# Patient Record
Sex: Female | Born: 1970 | Race: White | Hispanic: No | Marital: Married | Smoking: Never smoker
Health system: Southern US, Community
[De-identification: ages and names within clinical notes are randomized; demographics above are authoritative.]

## PROBLEM LIST (undated history)

## (undated) DIAGNOSIS — J45909 Unspecified asthma, uncomplicated: Secondary | ICD-10-CM

## (undated) HISTORY — PX: ABDOMINAL HYSTERECTOMY: SHX81

## (undated) HISTORY — DX: Unspecified asthma, uncomplicated: J45.909

---

## 1997-05-18 HISTORY — PX: APPENDECTOMY: SHX54

## 1998-08-11 ENCOUNTER — Ambulatory Visit (HOSPITAL_COMMUNITY): Admission: RE | Admit: 1998-08-11 | Discharge: 1998-08-11 | Payer: Self-pay | Admitting: Gynecology

## 1998-08-11 ENCOUNTER — Encounter: Payer: Self-pay | Admitting: Gynecology

## 1998-08-19 ENCOUNTER — Ambulatory Visit: Admission: RE | Admit: 1998-08-19 | Discharge: 1998-08-19 | Payer: Self-pay | Admitting: Gynecology

## 1998-10-23 ENCOUNTER — Encounter: Payer: Self-pay | Admitting: Gynecology

## 1998-10-23 ENCOUNTER — Ambulatory Visit (HOSPITAL_COMMUNITY): Admission: RE | Admit: 1998-10-23 | Discharge: 1998-10-23 | Payer: Self-pay | Admitting: Gynecology

## 1998-11-19 ENCOUNTER — Ambulatory Visit: Admission: RE | Admit: 1998-11-19 | Discharge: 1998-11-19 | Payer: Self-pay | Admitting: Gynecology

## 1998-11-28 ENCOUNTER — Ambulatory Visit: Admission: RE | Admit: 1998-11-28 | Discharge: 1998-11-28 | Payer: Self-pay | Admitting: Obstetrics & Gynecology

## 1998-12-01 ENCOUNTER — Encounter: Payer: Self-pay | Admitting: Obstetrics & Gynecology

## 1998-12-01 ENCOUNTER — Ambulatory Visit (HOSPITAL_COMMUNITY): Admission: RE | Admit: 1998-12-01 | Discharge: 1998-12-01 | Payer: Self-pay | Admitting: Gynecology

## 1999-02-09 ENCOUNTER — Encounter: Payer: Self-pay | Admitting: Gynecology

## 1999-02-09 ENCOUNTER — Ambulatory Visit (HOSPITAL_COMMUNITY): Admission: RE | Admit: 1999-02-09 | Discharge: 1999-02-09 | Payer: Self-pay | Admitting: Gynecology

## 1999-02-11 ENCOUNTER — Ambulatory Visit: Admission: RE | Admit: 1999-02-11 | Discharge: 1999-02-11 | Payer: Self-pay | Admitting: Gynecology

## 1999-04-29 ENCOUNTER — Encounter: Payer: Self-pay | Admitting: Gynecology

## 1999-04-29 ENCOUNTER — Ambulatory Visit: Admission: RE | Admit: 1999-04-29 | Discharge: 1999-04-29 | Payer: Self-pay | Admitting: Gynecology

## 1999-07-15 ENCOUNTER — Other Ambulatory Visit: Admission: RE | Admit: 1999-07-15 | Discharge: 1999-07-15 | Payer: Self-pay | Admitting: Obstetrics & Gynecology

## 1999-08-05 ENCOUNTER — Encounter: Payer: Self-pay | Admitting: Gynecology

## 1999-08-05 ENCOUNTER — Ambulatory Visit (HOSPITAL_COMMUNITY): Admission: RE | Admit: 1999-08-05 | Discharge: 1999-08-05 | Payer: Self-pay | Admitting: Gynecology

## 1999-09-16 ENCOUNTER — Ambulatory Visit: Admission: RE | Admit: 1999-09-16 | Discharge: 1999-09-16 | Payer: Self-pay | Admitting: Gynecology

## 2000-01-19 ENCOUNTER — Encounter: Payer: Self-pay | Admitting: Gynecology

## 2000-01-19 ENCOUNTER — Ambulatory Visit: Admission: RE | Admit: 2000-01-19 | Discharge: 2000-01-19 | Payer: Self-pay | Admitting: Gynecology

## 2000-02-23 ENCOUNTER — Ambulatory Visit (HOSPITAL_COMMUNITY): Admission: RE | Admit: 2000-02-23 | Discharge: 2000-02-23 | Payer: Self-pay | Admitting: Gynecology

## 2000-02-23 ENCOUNTER — Encounter: Payer: Self-pay | Admitting: Gynecology

## 2000-04-21 ENCOUNTER — Encounter: Payer: Self-pay | Admitting: Gynecology

## 2000-04-21 ENCOUNTER — Ambulatory Visit (HOSPITAL_COMMUNITY): Admission: RE | Admit: 2000-04-21 | Discharge: 2000-04-21 | Payer: Self-pay | Admitting: Gynecology

## 2000-04-27 ENCOUNTER — Ambulatory Visit: Admission: RE | Admit: 2000-04-27 | Discharge: 2000-04-27 | Payer: Self-pay | Admitting: Gynecology

## 2000-12-15 ENCOUNTER — Ambulatory Visit (HOSPITAL_COMMUNITY): Admission: RE | Admit: 2000-12-15 | Discharge: 2000-12-15 | Payer: Self-pay | Admitting: Obstetrics and Gynecology

## 2000-12-15 ENCOUNTER — Encounter: Payer: Self-pay | Admitting: Obstetrics and Gynecology

## 2001-05-02 ENCOUNTER — Inpatient Hospital Stay (HOSPITAL_COMMUNITY): Admission: AD | Admit: 2001-05-02 | Discharge: 2001-05-04 | Payer: Self-pay | Admitting: Obstetrics & Gynecology

## 2001-11-30 ENCOUNTER — Encounter: Payer: Self-pay | Admitting: Obstetrics & Gynecology

## 2001-11-30 ENCOUNTER — Ambulatory Visit (HOSPITAL_COMMUNITY): Admission: RE | Admit: 2001-11-30 | Discharge: 2001-11-30 | Payer: Self-pay | Admitting: Obstetrics & Gynecology

## 2001-12-06 ENCOUNTER — Ambulatory Visit: Admission: RE | Admit: 2001-12-06 | Discharge: 2001-12-06 | Payer: Self-pay | Admitting: Gynecology

## 2002-01-05 ENCOUNTER — Ambulatory Visit (HOSPITAL_COMMUNITY): Admission: RE | Admit: 2002-01-05 | Discharge: 2002-01-05 | Payer: Self-pay | Admitting: Gynecology

## 2002-01-05 ENCOUNTER — Encounter: Payer: Self-pay | Admitting: Gynecology

## 2002-01-16 ENCOUNTER — Ambulatory Visit: Admission: RE | Admit: 2002-01-16 | Discharge: 2002-01-16 | Payer: Self-pay | Admitting: Gynecology

## 2002-02-01 ENCOUNTER — Encounter: Payer: Self-pay | Admitting: Gynecology

## 2002-02-06 ENCOUNTER — Inpatient Hospital Stay (HOSPITAL_COMMUNITY): Admission: RE | Admit: 2002-02-06 | Discharge: 2002-02-09 | Payer: Self-pay | Admitting: Obstetrics & Gynecology

## 2002-02-06 ENCOUNTER — Encounter (INDEPENDENT_AMBULATORY_CARE_PROVIDER_SITE_OTHER): Payer: Self-pay

## 2002-03-20 ENCOUNTER — Ambulatory Visit: Admission: RE | Admit: 2002-03-20 | Discharge: 2002-03-20 | Payer: Self-pay | Admitting: Gynecology

## 2003-05-23 ENCOUNTER — Ambulatory Visit (HOSPITAL_COMMUNITY): Admission: RE | Admit: 2003-05-23 | Discharge: 2003-05-23 | Payer: Self-pay | Admitting: Gastroenterology

## 2004-02-16 HISTORY — PX: LAPAROSCOPIC CHOLECYSTECTOMY: SUR755

## 2004-02-17 ENCOUNTER — Encounter: Admission: RE | Admit: 2004-02-17 | Discharge: 2004-02-17 | Payer: Self-pay | Admitting: Obstetrics & Gynecology

## 2004-03-12 ENCOUNTER — Emergency Department (HOSPITAL_COMMUNITY): Admission: EM | Admit: 2004-03-12 | Discharge: 2004-03-13 | Payer: Self-pay | Admitting: Emergency Medicine

## 2004-03-13 ENCOUNTER — Ambulatory Visit (HOSPITAL_COMMUNITY): Admission: RE | Admit: 2004-03-13 | Discharge: 2004-03-13 | Payer: Self-pay | Admitting: Emergency Medicine

## 2004-03-13 ENCOUNTER — Encounter (INDEPENDENT_AMBULATORY_CARE_PROVIDER_SITE_OTHER): Payer: Self-pay | Admitting: Specialist

## 2004-03-13 ENCOUNTER — Inpatient Hospital Stay (HOSPITAL_COMMUNITY): Admission: EM | Admit: 2004-03-13 | Discharge: 2004-03-14 | Payer: Self-pay | Admitting: General Surgery

## 2004-06-05 ENCOUNTER — Emergency Department (HOSPITAL_COMMUNITY): Admission: EM | Admit: 2004-06-05 | Discharge: 2004-06-05 | Payer: Self-pay

## 2007-03-02 ENCOUNTER — Encounter: Admission: RE | Admit: 2007-03-02 | Discharge: 2007-03-02 | Payer: Self-pay | Admitting: Obstetrics & Gynecology

## 2007-05-11 ENCOUNTER — Ambulatory Visit: Payer: Self-pay | Admitting: Family Medicine

## 2007-05-11 DIAGNOSIS — L219 Seborrheic dermatitis, unspecified: Secondary | ICD-10-CM | POA: Insufficient documentation

## 2007-05-11 DIAGNOSIS — I10 Essential (primary) hypertension: Secondary | ICD-10-CM | POA: Insufficient documentation

## 2007-05-11 LAB — CONVERTED CEMR LAB
BUN: 12 mg/dL (ref 6–23)
Basophils Absolute: 0 10*3/uL (ref 0.0–0.1)
Basophils Relative: 0.2 % (ref 0.0–1.0)
Calcium: 9.5 mg/dL (ref 8.4–10.5)
Chloride: 101 meq/L (ref 96–112)
Eosinophils Absolute: 0.2 10*3/uL (ref 0.0–0.6)
GFR calc Af Amer: 145 mL/min
HCT: 41.9 % (ref 36.0–46.0)
Hemoglobin: 14.5 g/dL (ref 12.0–15.0)
Lymphocytes Relative: 33 % (ref 12.0–46.0)
MCHC: 34.5 g/dL (ref 30.0–36.0)
MCV: 80 fL (ref 78.0–100.0)
Monocytes Absolute: 0.5 10*3/uL (ref 0.2–0.7)
RBC: 5.23 M/uL — ABNORMAL HIGH (ref 3.87–5.11)
Sodium: 142 meq/L (ref 135–145)
VLDL: 17 mg/dL (ref 0–40)
WBC: 6 10*3/uL (ref 4.5–10.5)

## 2007-05-12 ENCOUNTER — Telehealth (INDEPENDENT_AMBULATORY_CARE_PROVIDER_SITE_OTHER): Payer: Self-pay | Admitting: *Deleted

## 2007-07-31 ENCOUNTER — Ambulatory Visit: Payer: Self-pay | Admitting: Family Medicine

## 2007-07-31 DIAGNOSIS — R0989 Other specified symptoms and signs involving the circulatory and respiratory systems: Secondary | ICD-10-CM | POA: Insufficient documentation

## 2007-07-31 DIAGNOSIS — R0609 Other forms of dyspnea: Secondary | ICD-10-CM | POA: Insufficient documentation

## 2007-08-02 ENCOUNTER — Telehealth (INDEPENDENT_AMBULATORY_CARE_PROVIDER_SITE_OTHER): Payer: Self-pay | Admitting: *Deleted

## 2007-08-11 ENCOUNTER — Ambulatory Visit: Payer: Self-pay | Admitting: Family Medicine

## 2007-08-11 ENCOUNTER — Telehealth (INDEPENDENT_AMBULATORY_CARE_PROVIDER_SITE_OTHER): Payer: Self-pay | Admitting: *Deleted

## 2007-08-16 ENCOUNTER — Ambulatory Visit: Payer: Self-pay | Admitting: Internal Medicine

## 2007-09-01 ENCOUNTER — Encounter: Payer: Self-pay | Admitting: Internal Medicine

## 2007-09-27 ENCOUNTER — Ambulatory Visit: Payer: Self-pay | Admitting: Internal Medicine

## 2007-12-06 ENCOUNTER — Telehealth (INDEPENDENT_AMBULATORY_CARE_PROVIDER_SITE_OTHER): Payer: Self-pay | Admitting: *Deleted

## 2008-08-30 ENCOUNTER — Ambulatory Visit: Payer: Self-pay | Admitting: Internal Medicine

## 2008-08-30 DIAGNOSIS — J189 Pneumonia, unspecified organism: Secondary | ICD-10-CM | POA: Insufficient documentation

## 2008-10-23 ENCOUNTER — Ambulatory Visit: Payer: Self-pay | Admitting: Internal Medicine

## 2008-12-31 ENCOUNTER — Telehealth (INDEPENDENT_AMBULATORY_CARE_PROVIDER_SITE_OTHER): Payer: Self-pay | Admitting: *Deleted

## 2009-01-01 ENCOUNTER — Ambulatory Visit: Payer: Self-pay | Admitting: Family Medicine

## 2009-01-02 ENCOUNTER — Telehealth: Payer: Self-pay | Admitting: Family Medicine

## 2009-01-02 ENCOUNTER — Telehealth (INDEPENDENT_AMBULATORY_CARE_PROVIDER_SITE_OTHER): Payer: Self-pay | Admitting: *Deleted

## 2009-01-10 ENCOUNTER — Ambulatory Visit: Payer: Self-pay | Admitting: Internal Medicine

## 2009-01-10 ENCOUNTER — Telehealth: Payer: Self-pay | Admitting: Internal Medicine

## 2009-01-10 DIAGNOSIS — R091 Pleurisy: Secondary | ICD-10-CM | POA: Insufficient documentation

## 2009-01-10 DIAGNOSIS — J45909 Unspecified asthma, uncomplicated: Secondary | ICD-10-CM | POA: Insufficient documentation

## 2009-01-10 DIAGNOSIS — R071 Chest pain on breathing: Secondary | ICD-10-CM | POA: Insufficient documentation

## 2009-01-16 ENCOUNTER — Encounter: Payer: Self-pay | Admitting: Internal Medicine

## 2009-01-20 ENCOUNTER — Ambulatory Visit: Payer: Self-pay | Admitting: Internal Medicine

## 2009-01-21 ENCOUNTER — Encounter (INDEPENDENT_AMBULATORY_CARE_PROVIDER_SITE_OTHER): Payer: Self-pay | Admitting: *Deleted

## 2009-02-11 ENCOUNTER — Ambulatory Visit: Payer: Self-pay | Admitting: Family Medicine

## 2009-02-11 DIAGNOSIS — J309 Allergic rhinitis, unspecified: Secondary | ICD-10-CM | POA: Insufficient documentation

## 2009-02-11 DIAGNOSIS — J029 Acute pharyngitis, unspecified: Secondary | ICD-10-CM | POA: Insufficient documentation

## 2009-03-06 ENCOUNTER — Ambulatory Visit: Payer: Self-pay | Admitting: Internal Medicine

## 2009-04-18 ENCOUNTER — Ambulatory Visit: Payer: Self-pay | Admitting: Internal Medicine

## 2009-04-18 DIAGNOSIS — R635 Abnormal weight gain: Secondary | ICD-10-CM | POA: Insufficient documentation

## 2009-07-22 ENCOUNTER — Telehealth (INDEPENDENT_AMBULATORY_CARE_PROVIDER_SITE_OTHER): Payer: Self-pay | Admitting: *Deleted

## 2010-11-06 ENCOUNTER — Ambulatory Visit
Admission: RE | Admit: 2010-11-06 | Discharge: 2010-11-06 | Payer: Self-pay | Source: Home / Self Care | Attending: Family Medicine | Admitting: Family Medicine

## 2010-11-06 DIAGNOSIS — J019 Acute sinusitis, unspecified: Secondary | ICD-10-CM | POA: Insufficient documentation

## 2010-11-19 NOTE — Assessment & Plan Note (Signed)
Summary: [name]   Vital Signs:  Patient profile:   40 year old female Weight:      261 pounds BMI:     42.93 Temp:     98.1 degrees F oral BP sitting:   120 / 68  (left arm)  Vitals Entered By: Doristine Devoid CMA (November 06, 2010 11:23 AM) CC: sinus congestion and HA   History of Present Illness: Frances [name] here today for ? sinus infxn.  sxs started 3 weeks ago w/ sinus headaches, 'the stabbing pain behind the eye headache'.  + fatigue, body aches.  increased SOB.  has had infrequent cough productive of green sputum.  using Qvar and albuterol as needed.  had some associated nausea w/ 1 episode of vomiting.  increased GERD- not currently on meds.  having facial pain/pressure.  no ear pain.  some nasal congestion.  Current Medications (verified): 1)  Estradiol 0.5 Mg Tabs (Estradiol) .Marland Kitchen.. 1 Once Daily 2)  Proair Hfa 108 (90 Base) Mcg/act  Aers (Albuterol Sulfate) .... Up 2 Puffs Every 4 Hours If Needed For Wheezing or Breathlessness 3)  Anti-Oxidant  Tabs (Multiple Vitamin) .... Once Daily 4)  Diurex .... Otc As Needed 5)  Qvar 40 Mcg/act  Aers (Beclomethasone Dipropionate) .... 2 Puffs First Thing  in Am and 2 Puffs Again in Pm About 12 Hours Later 6)  Mucinex 600 Mg Xr12h-Tab (Guaifenesin) .... 2 Every 12 Hours For Cough or Congestion  Allergies (verified): No Known Drug Allergies  Review of Systems      See HPI  Physical Exam  General:  Well-developed,well-nourished,in no acute distress; alert,appropriate and cooperative throughout examination.  Overwt Head:  Normocephalic and atraumatic without obvious abnormalities. No apparent alopecia or balding.  + TTP over sinuses Eyes:  no injxn or inflammation Ears:  External ear exam shows no significant lesions or deformities.  Otoscopic examination reveals clear canals, tympanic membranes are intact bilaterally without bulging, retraction, inflammation or discharge. Hearing is grossly normal bilaterally. Nose:   edematous turbinates, mucosal pallor, clear rhinorrhea Mouth:  + post nasal drip, + tonsilar enlargement w/out exudates. Neck:  No deformities, masses, or tenderness noted. Lungs:  Normal respiratory effort, chest expands symmetrically. Lungs are clear to auscultation, no crackles or wheezes. Heart:  Normal rate and regular rhythm. S1 and S2 normal without gallop, murmur, click, rub or other extra sounds.   Impression & Recommendations:  Problem # 1:  SINUSITIS - ACUTE-NOS (ICD-461.9) Assessment New sxs and PE consistent w/ sinus infxn.  start abx.  reviewed supportive care and red flags that should prompt return.  Pt expresses understanding and is in agreement w/ this plan. Her updated medication list for this problem includes:    Mucinex 600 Mg Xr12h-tab (Guaifenesin) .Marland Kitchen... 2 every 12 hours for cough or congestion    Amoxicillin 500 Mg Tabs (Amoxicillin) .Marland Kitchen... 2 tabs by mouth two times a day x10 days.  take w/ food.  Complete Medication List: 1)  Estradiol 0.5 Mg Tabs (Estradiol) .Marland Kitchen.. 1 once daily 2)  Proair Hfa 108 (90 Base) Mcg/act Aers (Albuterol sulfate) .... Up 2 puffs every 4 hours if needed for wheezing or breathlessness 3)  Anti-oxidant Tabs (Multiple vitamin) .... Once daily 4)  Diurex  .... Otc as needed 5)  Qvar 40 Mcg/act Aers (Beclomethasone dipropionate) .... 2 puffs first thing  in am and 2 puffs again in pm about 12 hours later 6)  Mucinex 600 Mg Xr12h-tab (Guaifenesin) .... 2 every 12 hours for cough or congestion  7)  Amoxicillin 500 Mg Tabs (Amoxicillin) .... 2 tabs by mouth two times a day x10 days.  take w/ food. 8)  Prilosec 40 Mg Cpdr (Omeprazole) .Marland Kitchen.. 1 tab daily  Patient Instructions: 1)  Start the Amoxicillin as directed for the sinus infection- take w/ food 2)  Drink plenty of fluids 3)  Add Mucinex to thin your congestion 4)  Start the Prilosec for the acid reflux 5)  Ibuprofen as needed for pain or fever 6)  Use the Qvar regularly while not feeling  well 7)  Albuterol as needed 8)  Call with any questions or concerns 9)  Hang in there!!! Prescriptions: QVAR 40 MCG/ACT  AERS (BECLOMETHASONE DIPROPIONATE) 2 puffs first thing  in am and 2 puffs again in pm about 12 hours later  #1 x 3   Entered and Authorized by:   Neena Rhymes MD   Signed by:   Neena Rhymes MD on 11/06/2010   Method used:   Electronically to        [facility] [address] [phone]* (retail)       [address]       [place], [place]  [address]       Ph: [phone] or [phone]       Fax: [phone]   RxID:   [id number] PRILOSEC 40 MG CPDR (OMEPRAZOLE) 1 tab daily  #30 x 1   Entered and Authorized by:   Neena Rhymes MD   Signed by:   Neena Rhymes MD on 11/06/2010   Method used:   [facility]ically to        [facility] [address] [phone]* (retail)       [address]       [place], [place]  [address]       Ph: [phone] or [phone]       Fax: [phone]   RxID:   [id number] AMOXICILLIN 500 MG TABS (AMOXICILLIN) 2 tabs by mouth two times a day x10 days.  take w/ food.  #40 x 0   Entered and Authorized by:   Neena Rhymes MD   Signed by:   Neena Rhymes MD on 11/06/2010   Method used:   [facility]ronically to        [facility] [address] [phone]* (retail)       [address]       [place], [place]  [address]       Ph: [phone] or [phone]       Fax: [phone]   RxID:   [id number]    Orders Added: 1)  Est. Patient Level III [78938]

## 2011-03-02 NOTE — Assessment & Plan Note (Signed)
[facility]                             PULMONARY OFFICE NOTE   NAME:[name], Frances [name] PHILIPPS                  MRN:          [id number]  DATE:08/16/2007                            DOB:          06/25/1971    CHIEF COMPLAINT:  Dyspnea.   HISTORY:  A 40 year old white female, never smoker, presenting with new  onset dyspnea that occurs in spells several times a year and lasting  several weeks.  Typically made somewhat better from prednisone and  albuterol.  She returns today having recently received another course  of prednisone and albuterol  stating she is almost back to normal.  However, she is still short of breath walking any significant distance  (for instance across the parking lot) and had a constant sensation of  the urge to clear her throat while she was in my office.  Otherwise, she  feels normal now.   The patient has no history of any childhood or adult asthma and had no  trouble with pregnancy in terms of any respiratory complaints with  children now 82 and 83 years old.  She does state that stress and staying  up late seems to make her breathing worse and also exposure to cleaning  agents, but no significant seasonal pattern to her spells of dyspnea  associated with a sensation of cough and congestion but absent any  significant mucus production and not associated with any nocturnal  episodes of cough.  She says there is times where she can cough so hard  she looses her breath and feels like she is gagging.   PAST MEDICAL HISTORY:  Remote cholecystectomy, hysterectomy and  appendectomy.   ALLERGIES:  NONE KNOWN.   MEDICATIONS:  1. Estradiol.  2. Hydrochlorothiazide.  3. Uses p.r.n. albuterol as noted, typically not requiring except      during the spells.   SOCIAL HISTORY:  Never smoked.  Denies any unusual travel, pet or hobby  exposure.   FAMILY HISTORY:  Positive for emphysema in her father who was a smoker.   REVIEW OF  SYSTEMS:  Taken in detail on the worksheet and negative except  as outlined above.   PHYSICAL EXAMINATION:  This is an obese, ambulatory, pleasant white  female in no acute distress who cleared her throat frequently during the  interview and exam.  She is afebrile, normal vital signs.  HEENT:  Unremarkable, oropharynx was clear, nasal turbinates were  normal, ear canals clear bilaterally.  NECK:  Supple without cervical adenopathy or tenderness, trachea is  midline, no thyromegaly.  LUNGS:  Fields perfectly clear bilaterally to auscultation and  percussion.  There is a regular rate and rhythm without murmur, gallop or rub.  ABDOMEN:  Soft, benign.  EXTREMITIES:  Warm without calf tenderness, cyanosis, clubbing or edema.   IMPRESSION:  Her spells on the surface suggest asthma because she is  partially responsive to albuterol and prednisone, but note that they do  not occur at night and even on prednisone she continues to have a  difficulty with walking across the parking lot and has frequent throat  clearing and throat congestion  strongly suggestive of an upper airway  mechanism mimicking asthma.  Because of morbid obesity the most likely  mechanism, driving both the upper and lower airway instability would be  reflux and therefore I spent some time educating her regarding the  central role reflux plays in many respiratory syndromes in older adults  and recommended the following approach:  1. Start Nexium perfectly regularly 30 minutes before meals daily      until seen back here in the office in 4 weeks for pulmonary      function tests.  2. Suppress excess cough with Delsym 2 teaspoons every 12 hours p.r.n.  3. Taper completely off of prednisone as per Dr. Laqueta Linden previous      instructions.  4. Continue to use albuterol but with the goal of eliminating the need      for rescue therapy completely if indeed reflux is playing a major      role here.  If not, and we can document  she has airflow obstruction      I would recommend initiating inhaled steroids and/or a trial of      Singulair on her next visit.     Charlaine Dalton. Sherene Sires, MD, [facility]  Electronically Signed    MBW/MedQ  DD: 08/16/2007  DT: 08/17/2007  Job #: [id number]   cc:   Leanne Chang, M.D.

## 2011-03-05 NOTE — Consult Note (Signed)
[facility]  Patient:    Frances [name], Frances [name]                    MRN: [id number] Attending:  De Blanch, M.D.                          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  She has been followed with serial ultrasounds and has had some small cysts appear on the left ovary periodically.  Todays ultrasound shows that the cysts are smaller, or at least stable.  The patients CA-125 value recently also was normal.  I indicated to Frances [name] that  I felt that this is persistence and unlikely to be a malignant potential tumor and that it would be okay for her to pursue a second pregnancy.  She will return to see me as previously scheduled. DD:  02/23/00 TD:  02/23/00 Job: [id number]

## 2011-03-05 NOTE — Discharge Summary (Signed)
[facility]  Patient:    Frances [name], Frances [name] Visit Number: [id number] MRN: [id number]          Service Type: GYN Location: 4W 0447 01 Attending Physician:  Mickle Mallory Dictated by:   Gerrit Friends. Aldona Bar, M.D. Admit Date:  02/06/2002 Discharge Date: 02/09/2002   CC:         [name], M.D.  Telford Nab, R.N.   Discharge Summary  DISCHARGE DIAGNOSIS:  Borderline serous tumor of left ovary, tumor of low malignant potential.  PROCEDURES:  Total abdominal hysterectomy, left salpingo-oophorectomy, peritoneal washings.  HISTORY:  This 40 year old female was admitted after observation for a period of time because of an enlarging left ovarian cyst felt to be suspicious for borderline tumor of low malignant potential, similar to what the patient had several years ago while living in [name] for which she underwent at that time a right salpingo-oophorectomy.  HOSPITAL COURSE:  The patient was admitted after a normal preoperative workup and was taken to the operating room where she underwent a total abdominal hysterectomy, left salpingo-oophorectomy, lysis of adhesions, and peritoneal washings.  Dr. De Blanch was the primary surgery, assisted by myself.  The tumor returned on frozen section as a borderline tumor of low malignant potential, and this was confirmed on the final pathologic readout.  Peritoneal washings were negative.  The patients postoperative course was totally benign.  On the morning of April 25, she was ambulating well, tolerating a regular diet well, having normal bowel and bladder function, and was afebrile.  Her wound was clean and dry.  Her staples were not removed, and she was deemed ready for discharge.  Accordingly, she was given all appropriate instructions, and she will return to see Telford Nab, R.N. on Monday, April 28 for staple removal.  DISCHARGE MEDICATIONS: 1. Vivelle Dot  0.1 mg twice weekly. 2. Tylox 1 to 2 q.4-6h. as needed for pain. 3. Motrin 600 mg every 6 hours as needed for less severe pain. 4. Over-the-counter medications for constipation and/or gas as needed.  FOLLOWUP:  She will return to see me in the office in approximately four weeks time or as needed.  LABORATORY DATA:  Her discharge hemoglobin was 13.8, white count 7800, and platelet count 248,000.  CONDITION UPON DISCHARGE:  Improved. Dictated by:   Gerrit Friends. Aldona Bar, M.D. Attending Physician:  Mickle Mallory DD:  02/09/02 TD:  02/10/02 Job: [id number]

## 2011-03-05 NOTE — Op Note (Signed)
[name], Frances [name]                     ACCOUNT NO.:  [id number]   MEDICAL RECORD NO.:  [id number]                   PATIENT TYPE:  INP   LOCATION:  0449                                 FACILITY:  [facility]   PHYSICIAN:  Sharlet Salina T. Hoxworth, M.D.          DATE OF BIRTH:  July 05, 1971   DATE OF PROCEDURE:  03/13/2004  DATE OF DISCHARGE:  03/14/2004                                 OPERATIVE REPORT   PREOPERATIVE DIAGNOSIS:  Cholelithiasis and acute cholecystitis.   POSTOPERATIVE DIAGNOSIS:  Cholelithiasis and acute cholecystitis.   SURGICAL PROCEDURE:  Laparoscopic cholecystectomy.   SURGEON:  Dr. Johna Sheriff   ASSISTANT:  Dr. Kendrick Ranch   ANESTHESIA:  General.   BRIEF HISTORY:  [name] is a 40 year old white female, who  presents with persistent right upper quadrant abdominal pain, nausea, and  vomiting.  She has had a work-up including a gallbladder ultrasound showing  gallstones thickening and inflammation of the gallbladder wall.  LFTs are  normal.  Urgent laparoscopic cholecystectomy has been recommended and  accepted.  The nature of the procedure, its indications, risks of bleeding,  infection, bile leak, bile duct injury were discussed and understood.  She  is now brought to the operating room for this procedure.   DESCRIPTION OF OPERATION:  The patient brought to the operating room and  placed in the supine position on the operating table, and general  endotracheal anesthesia was induced.  She had received preoperative  antibiotics.  PAS were placed.  The abdomen was sterilely prepped and  draped.  Local anesthesia was used to infiltrate the trocar sites prior to  the incisions.  A 1 cm incision was made in the umbilicus and dissection  carried down to the midline fascia which was sharply incised for 1 cm and  the peritoneum entered under direct vision.  Through a mattress suture of 0  Vicryl, the Hasson trocar was placed and pneumoperitoneum established.  Under  direct vision, a 10 mm trocar was placed in the subxiphoid area and  two 5 mm trocars on the right subcostal margin.  The gallbladder was  visualized and was acutely inflamed and edematous.  It was decompressed with  an aspiration needle.  Fundus was then grasped and elevated up over the  liver the infundibulum retracted inferolaterally.  Fibrofatty tissue was  stripped off the neck of the gallbladder toward the porta hepatis.  The  dissection was relatively easy through very edematous tissue.  The distal  gallbladder and Calot's triangle was thoroughly dissected.  The cystic  artery was identified coursing up on the gallbladder wall.  The cystic duct  was dissected free and the cystic duct gallbladder junction dissected 360  degrees.  When the anatomy was completely cleared, the cystic artery and  cystic duct were doubly clipped proximally, clipped distally, and divided.  The gallbladder was then dissected free from the bed using hook cautery.  It  was placed in an EndoCatch bag and  brought out through the umbilicus.  Complete hemostasis was assured in the gallbladder bed.  The right upper  quadrant was thoroughly irrigated and suctioned until clear.  Trocars were  removed under direct vision, all CO2 evacuated from the peritoneal cavity.  The mattress suture was secured at  the umbilicus.  Skin incisions were closed with interrupted subcuticular 4-0  Monocryl and Steri-Strips.  Sponge, needle, and instrument counts were  correct.  Dry sterile dressings were applied and the patient taken to  recovery in good condition.                                               Lorne Skeens. Hoxworth, M.D.    Tory Emerald  D:  03/13/2004  T:  03/14/2004  Job:  [id number]

## 2011-03-05 NOTE — H&P (Signed)
Frances [name], Frances [name]                     ACCOUNT NO.:  [id number]   MEDICAL RECORD NO.:  [id number]                   PATIENT TYPE:  INP   LOCATION:  0449                                 FACILITY:  [facility]   PHYSICIAN:  Sharlet Salina T. Hoxworth, M.D.          DATE OF BIRTH:  [birth year]   DATE OF ADMISSION:  03/13/2004  DATE OF DISCHARGE:                                HISTORY & PHYSICAL   CHIEF COMPLAINT:  Right upper quadrant abdominal pain.   HISTORY OF PRESENT ILLNESS:  Frances [name] is a 40 year old white female  in her usual state of good health until yesterday about 3 p.m. which is now  24 hours ago.  At that time she developed the acute onset of right upper  quadrant abdominal pain about 3 hours after eating.  It was a pressure-like  cramping pain without any exacerbating or alleviating factors.  The pain  became quite severe and she developed nausea and frequent vomiting.  She  presented to the [facility] emergency room last night and was evaluated.  Her symptoms were treated and she was released to return this morning for  ultrasound.  On return she was having persistent pain and ultrasound has  shown gallstones and evidence of acute cholecystitis.  She requested  transfer to [facility] and is admitted.  She denies any previous similar  symptoms.  Her pain is currently a little better but still present.  No  recent vomiting.  She did not have any fever, chills, or jaundice.   PAST MEDICAL HISTORY:  Surgery:  1. Appendectomy and right oophorectomy.  2. Hysterectomy and left oophorectomy.   Medical:  1. Colonoscopy 1 year ago for bloody diarrhea felt to be infectious     enteritis without recurrence.  2. Mild peripheral edema since hormone replacement.   Current medications are Climara patch weekly and  hydrochlorothiazide/triamterene 25/37.5 daily.  No known drug allergies.   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  Does not  smoke cigarettes  or drink alcohol.   FAMILY HISTORY:  Father recently had a cholecystectomy for gallstones.  Mother has a history of colon cancer and DVT.  Maternal grandmother with  breast cancer.   REVIEW OF SYSTEMS:  GENERAL:  No fever, chills, weight change, malaise.  RESPIRATORY:  No shortness of breath, cough, wheezing.  HEENT:  No  breathing, swallowing, or hearing problems.  ABDOMEN:  GI as above.  GU:  No  urinary frequency, burning, hematuria.  EXTREMITIES:  No joint pain.  HEMATOLOGIC:  No history of abnormal bleeding or blood clots.   PHYSICAL EXAMINATION:  VITAL SIGNS:  Temperature is 98, pulse 67,  respirations 18, blood pressure 129/79.  GENERAL:  She is a mildly-obese white female in no acute distress.  SKIN:  Warm and dry without rash or infection.  HEENT:  No palpable mass or thyromegaly.  Sclerae nonicteric.  Nares and  oropharynx clear.  LUNGS:  Clear to auscultation without increased work of breathing.  CARDIAC:  Regular rate without murmurs, no edema.  ABDOMEN:  Well-localized right upper quadrant tenderness with guarding.  No  palpable masses or hepatosplenomegaly.  There is a well-healed low midline  incision.  EXTREMITIES:  No joint swelling or deformity.  NEUROLOGIC:  She is alert and fully oriented.  Motor exam is grossly normal.   LABORATORY DATA:  CBC, LFTs, amylase, lipase, urinalysis all normal.   Abdominal ultrasound is reviewed which shows multiple gallstones and  thickening of the gallbladder wall and pericholecystic stranding consistent  with acute cholecystitis.   ASSESSMENT AND PLAN:  Cholelithiasis and acute cholecystitis.  Will plan to  proceed with urgent laparoscopic cholecystectomy.  This ws discussed with  the patient including indications; risks of bleeding, infection, and bile  duct injury.                                               Lorne Skeens. Hoxworth, M.D.    Tory Emerald  D:  03/13/2004  T:  03/14/2004  Job:  [id number]

## 2011-03-05 NOTE — Consult Note (Signed)
[facility]  Patient:    Frances [name], Frances [name] Visit Number: [id number] MRN: [id number]          Service Type: Attending:  Rande Brunt. Clarke-Pearson, M.D. Dictated by:   Rande Brunt. Clarke-Pearson, M.D. Proc. Date: 11/26/01   CC:         Telford Nab, R.N.  Gerrit Friends. Aldona Bar, M.D.   Consultation Report  REASON FOR CONSULTATION:  This is a 40 year old, white female who returns with new onset of an ovarian cyst. The patient had a normal spontaneous vaginal delivery of a healthy baby boy approximately 7 months ago. Follow-up ultrasound on November 30, 2001, showed a 2.6 cm complicated left ovarian cyst with mural nodularity and color flow associated with several of the nodules. The uterus appeared normal. The CA125 value was 14.  HISTORY OF PRESENT ILLNESS:  The patient has a past history of a borderline ovarian tumor initially diagnosed in September 1998. 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 She underwent a right salpingo-oophorectomy. She has been followed since that time with serial ultrasounds and CA125s which have shown a variety of cysts, none of which have persisted.  From a symptom point of view, the patient is doing well. She has a little bit of lower abdominal pain but this seems to be bilateral. She denies any nausea or vomiting or any GI or GU symptoms.  REVIEW OF SYSTEMS:  Negative except as noted above.  FAMILY HISTORY/SOCIAL HISTORY:  Reviewed; gravida 2.  PHYSICAL EXAMINATION:  VITAL SIGNS:  Weight 230 pounds.  GENERAL:  The patient is a pleasant, obese young woman in no acute distress.  HEENT:  Negative.  NECK:  Supple without thyromegaly.  ABDOMEN:  Soft, nontender. No masses, organomegaly, ascites, or hernias are noted. She has a well-healed, midline incision.  PELVIC:  EG/BUS normal. Vagina is clean. Cervix is normal. Uterus is retroverted, normal shape, size, and existence. She  has no adnexal masses noted.  RECTOVAGINAL:  Confirmed.  IMPRESSION:  Complex 2-1/2 cm ovarian cyst with mural nodularity.  PLAN:  While this may be a new ovarian neoplasm, I think it would be worthwhile reimaging the cyst in approximately 6 weeks to make sure it is not a functional cyst given the fact that the patient is having cyclic menstrual periods. We will schedule her to have a repeat ultrasound in early March and then proceed as necessary from there. The patient is aware that this may be an ovarian neoplasm, and she may well need to have surgery to remove it which could ultimately result in loss of her remaining ovary. All of her questions are answered. Dictated by:   Rande Brunt. Clarke-Pearson, M.D. Attending:  Rande Brunt. Clarke-Pearson, M.D. DD:  12/06/01 TD:  12/07/01 Job: [id number]

## 2011-03-05 NOTE — Consult Note (Signed)
[facility]  Patient:    Frances [name], PANDEY Visit Number: [id number] MRN: [id number]          Service Type: GON Location: GYN Attending Physician:  Jeannette Corpus Dictated by:   Rande Brunt Clarke-Pearson, M.D. Proc. Date: 03/20/02 Admit Date:  03/20/2002 Discharge Date: 03/20/2002   CC:         Gerrit Friends. Aldona Bar, M.D.  Telford Nab, R.N.   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  She had had a prior borderline tumor in her right ovary in September 1998.  The patient has had an uncomplicated postoperative recovery from her recent surgery.  She denies any GI or GU symptoms, has no pelvic pain, pressure, vaginal bleeding, or discharge.  Overall her functional status is very good.  PHYSICAL EXAMINATION:  VITAL SIGNS:  Weight 232 pounds, blood pressure 125/80.  GENERAL:  The patient is a healthy white female in no acute distress.  HEENT:  Negative.  NECK:  Supple without thyromegaly.  ABDOMEN:  Obese, soft, nontender.  No mass, organomegaly, ascites, or hernia is noted.  The midline incision is well-healed.  PELVIC:  EG, BUS normal.  The vagina is clean.  Cuff is healing nicely. Bimanual and rectovaginal exam reveal no masses, induration, or nodularity.  IMPRESSION:  Borderline tumors of both ovaries in 1998 and 2003.  These have been resected, and I think the patient is at very low risk to have any further problems with recurrence of this borderline ovarian tumor.  She is given the okay to return to full levels of activity, and I will ask her to return to see Gerrit Friends. Aldona Bar, M.D., on an annual basis.  Given the fact that her CA125s were never elevated, I do not believe monitoring CA125 would be of  any value at this juncture. Dictated by:   Rande Brunt. Clarke-Pearson, M.D. Attending Physician:  Jeannette Corpus DD:  03/20/02 TD:  03/22/02 Job: [id number]

## 2011-03-05 NOTE — Op Note (Signed)
[facility]  Patient:    Frances [name], Frances [name] Visit Number: [id number] MRN: [id number]          Service Type: GYN Location: 1S X006 01 Attending Physician:  Mickle Mallory Dictated by:   Rande Brunt Clarke-Pearson, M.D. Proc. Date: 02/06/02 Admit Date:  02/06/2002   CC:         Gerrit Friends. Aldona Bar, M.D.  Telford Nab, R.N.   Operative Report  PREOPERATIVE DIAGNOSIS:  Complex left adnexal mass.  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  PROCEDURE: 1. Exploratory laparotomy. 2. Total abdominal hysterectomy. 3. Left salpingo-oophorectomy.  SURGEON:  Daniel L. Clarke-Pearson, M.D.  ASSISTANT:  Gerrit Friends. Aldona Bar, M.D. and Telford Nab, R.N.  ANESTHESIA:  General with orotracheal tube.  ESTIMATED BLOOD LOSS:  50 cc.  SURGICAL FINDINGS:  Surgical findings at the time of exploratory laparotomy: The upper abdomen, including liver, diaphragm, spleen, omentum, stomach, and colon were normal. There were no enlarged pelvic or periaortic lymph nodes. There was no evidence of ascites. The left ovary was cystic, measuring approximately 6 cm in diameter, the surface was smooth with no excrescences, and was removed intact. On frozen section, we were told this is a borderline tumor of serous histology.  DESCRIPTION OF PROCEDURE:  The patient was brought to the operating room and after satisfactory attainment of general anesthesia was placed in a modified lithotomy position in Hamlin stirrups. The anterior abdominal wall, perineum and vagina were prepped, Foley catheter was placed, and the patient was draped. The abdomen was entered through a prior low midline incision excising the prior scar. Peritoneal washings were obtained from the pelvis. The upper abdomen and pelvis were explored with the above noted findings. Some adhesions of the small bowel and omentum to the anterior abdominal wall  were lysed with sharp dissection. Bookwalter retractor was positioned and the bowel was packed out of the pelvis. The uterus was grasped with long Kelly clamps and the round ligaments were divided with cautery. The retroperitoneal spaces were opened identifying the vessels and ureter. The ovarian vessels on the left side were skeletonized, clamped, cut, free tied and suture ligated. The left tube and ovary were then removed and submitted to frozen section. The right pelvic sidewall was opened by dividing the round ligament, the lateral peritoneum and opening the perirectal space, the ureter was again identified. The ovarian vessels on the right were skeletonized, clamped, cut, free tied and suture ligated. The bladder flap was then incised and then advanced with sharp and blunt dissection. The uterine vessels were skeletonized, clamped, cut, and suture ligated. In a stepwise fashion, the paracervical and cardinal ligaments were clamped, cut, and suture ligated using 2-0 Vicryl suture. The vaginal angles were cross-clamped and the vagina transected with its connection to the cervix. The vaginal angles were transfixed using 0 Vicryl and the central portion of the vaginal cuff closed with interrupted figure-of-eight sutures of 0 Vicryl. The pelvis was irrigated. Hemostasis was achieved. The packs and retractors were removed. The anterior abdominal wall was closed in layers; the first being a running mass closure using #1 PDS. The subcutaneous tissue was irrigated, hemostasis achieved with cautery, and the subcutaneous reapproximated with interrupted 3-0 Vicryl sutures. The skin was closed with skin staples. A dressing was applied. The patient was awakened from anesthesia and taken to the recovery room in satisfactory condition. Sponge, needle, and instrument counts correct x 2.  DESCRIPTION OF PROCEDURE: Dictated by:  Daniel L. Clarke-Pearson, M.D. Attending Physician:  Mickle Mallory DD:  02/06/02 TD:  02/06/02 Job: [id number]

## 2011-03-05 NOTE — Consult Note (Signed)
[facility]  Patient:    Frances [name], Frances [name] [facility]             MRN: [id number] Proc. Date: 04/27/00 Adm. Date:  [id number] Attending:  Jeannette Corpus CC:         Telford Nab, N.P.             Gerrit Friends. Aldona Bar, M.D.             Katrine Coho, M.D.-Div.GYN/[employer] [place]                          Consultation Report  CHIEF COMPLAINT:  This 40 year old white female who returns for continuing management of a low-grade ovarian tumor.  This is a stage IC, with resection of the right ovary in September 1998.  HISTORY OF PRESENT ILLNESS:  The patient has been followed with serial ultrasounds and CA125 values.  The ultrasounds have shown intermittent cysts in the left ovary. She had an ultrasound again on April 21, 2000, which shows the left ovary containing a 1.6 cm septated cyst which is slightly smaller than prior examination in May.  There is no solid-appearing mass or any evidence of free fluid.  There has been  some question as to whether there are any ovarian remnants on the right, although in a prior discussion with the radiologist, it was felt that this might be a loop of bowel adherent to the pelvic side wall.  The patient and her husband are desirous of having another child, and have begun having unprotected intercourse, in hopes of achieving another pregnancy.  She has regular cyclic menstrual periods, and has only been attempting this for approximately two months.  REVIEW OF SYSTEMS:  Reveals no cardiovascular or pulmonary, renal, or neurologic symptoms.  FAMILY HISTORY/SOCIAL HISTORY:  Are reviewed and are unchanged.  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 They recently returned from a vacation to [place].  PHYSICAL EXAMINATION:  VITAL SIGNS:  Weight 220 pounds.  GENERAL:  The patient is a healthy white female, in no acute distress.  HEENT:   Negative.  NECK:  Supple without thyromegaly.  NODES:  There is no supraclavicular or inguinal adenopathy.  ABDOMEN:  Soft, nontender.  No masses or organomegaly, ascites, or hernia noted.  PELVIC:  EG, BUS normal.  Vagina is clean.  Cervix is normal.  Uterus is retroverted, normal shape, size, and consistency.  There is no adnexal masses noted.  RECTOVAGINAL:  Examination confirms.  IMPRESSION:  Past history of a low malignant potential tumor of the right ovary, status post right salpingo-oophorectomy in September 1998, no evidence of recurrent disease.  PLAN:  The ultrasound report is reviewed with the patient.  She is reassured regarding these findings.  We will obtain a CA125 today, and have her return to see [place] in six months for followup.  In the interval, she is scheduled to see Dr. Gerrit Friends. Wein for her annual examination, which will include a Pap smear. DD:  04/27/00 TD:  04/27/00 Job: [id number]

## 2011-03-05 NOTE — Consult Note (Signed)
[facility]  Patient:    Frances [name], Frances [name] Visit Number: [id number] MRN: [id number]          Service Type: GON Location: GYN Attending Physician:  Jeannette Corpus Dictated by:   Rande Brunt. Clarke-Pearson, M.D. Proc. Date: 01/16/02 Admit Date:  12/06/2001 Discharge Date: 12/06/2001                            Consultation Report  A 40 year old white female returns for continuing follow-up of a borderline tumor of the ovary.  She initially was found to have a borderline tumor stage IC in September 1998 found at the time of cesarean section.  The entire ovary and tube were removed.  The patient then moved to [facility].  We have followed her since that time with serial ultrasounds and CA-125.  Unfortunately, the patient has developed a small cyst with internal nodules on an ultrasound obtained November 30, 2001.  The left ovarian cyst with mural nodularity measured 2.6 cm in diameter and there was blood flow associated with several of the mural nodules.  On the outside chance this was a functional cyst we have reevaluated the patient and repeat ultrasound on March 21 shows a slight interval increase in the complex cyst size now measuring 3.0 cm in maximal diameter.  The cyst continues to have internal nodules.  The uterus is normal.  PAST MEDICAL HISTORY:  None.  CURRENT MEDICATIONS:  None.  ALLERGIES:  None.  FAMILY HISTORY:  Maternal grandmother who died of breast cancer.  No family history of ovarian cancer.  REVIEW OF SYSTEMS:  Essentially negative except as noted above.  PAST OBSTETRICAL HISTORY:  Gravida 2.  Patient had her most recent child approximately a year ago.  PHYSICAL EXAMINATION  VITAL SIGNS:  Weight 231 pounds, blood pressure 122/78.  GENERAL:  The patient is a healthy white female in no acute distress.  HEENT:  Negative.  NECK:  Supple without thyromegaly.  LYMPH:  There is no supraclavicular or inguinal  adenopathy.  ABDOMEN:  Obese, soft, nontender.  No mass, organomegaly, ascites, or hernias noted.  Midline incision is well healed.  PELVIC:  EGBUS normal.  Vagina is clean, well supported.  Cervix is normal. Uterus is anterior, normal shape, size, and consistency.  I cannot feel the adnexal mass.  The ultrasound reports are reviewed.  IMPRESSION:  Past history of right stage IC borderline tumor of the ovary. The patient now appears to have a new ovarian neoplasm in the contralateral ovary with increasing size and mural nodularity.  The patient is aware that I cannot rule out invasive cancer or borderline tumor and therefore I would recommend that this be evaluated surgically.  After discussing the pros and cons of conservative surgery versus resection of the entire ovary, the patient strongly desires of once and for all eliminating the risk of her having recurrent borderline tumor and therefore would prefer to have a total abdominal hysterectomy and left salpingo-oophorectomy.  She is aware that surgical staging including omentectomy and lymphadenectomy might be necessary should she have a truly invasive cancer.  She wished to proceed with surgery in the very near future.  We will coordinate this with Dr. Annamaria Helling.  The risks of surgery including hemorrhage, infection, injury to adjacent viscera, and thromboembolic complications were discussed.  Patient is also aware that she will likely require hormone replacement therapy following removal of her one remaining ovary.  She accepts all  these risks.  All of her questions were answered.  We will coordinate surgery as noted above. Dictated by:   Rande Brunt. Clarke-Pearson, M.D. Attending Physician:  Jeannette Corpus DD:  01/16/02 TD:  01/17/02 Job: [id number]

## 2011-03-05 NOTE — Op Note (Signed)
   NAME:  Frances [name], Frances [name]                     ACCOUNT NO.:  [id number]   MEDICAL RECORD NO.:  [id number]                   PATIENT TYPE:  AMB   LOCATION:  ENDO                                 FACILITY:  [facility]   PHYSICIAN:  Graylin Shiver, M.D.                DATE OF BIRTH:  June 24, 1971   DATE OF PROCEDURE:  05/23/2003  DATE OF DISCHARGE:                                 OPERATIVE REPORT   PROCEDURE:  Colonoscopy.   INDICATIONS FOR PROCEDURE:  This patient is a 40 year old with a family  history of colon cancer in her mother who was diagnosed at age 34. The  patient also has been experiencing chronic right lower quadrant abdominal  pain. She is interested in a colonoscopy to evaluate her colon and make sure  that there is not an underlying significant lesion.   PREMEDICATION:  Fentanyl 75 mcg IV, Versed 10 mg IV.   Informed consent was obtained after explanation of the risks of bleeding,  infection and perforation.   DESCRIPTION OF PROCEDURE:  With the patient in the left lateral decubitus  position, a rectal exam was performed and no masses were felt. The Olympus  colonoscope was then inserted into the rectum and advanced around the colon  to the cecum. Cecal landmarks were identified. The terminal ileum was  intubated and the first few centimeters of terminal ileum were inspected and  looked normal. The cecum and ascending colon were normal. The transverse  colon was normal.  The descending colon, sigmoid and rectum were normal. She  tolerated the procedure well without complications.   IMPRESSION:  Normal colonoscopy to the cecum.   There is nothing on this exam to explain the patient's chronic right lower  quadrant abdominal pain. I would recommend a followup screening colonoscopy  when she reaches age 29.                                               Graylin Shiver, M.D.    Germain Osgood  D:  05/23/2003  T:  05/23/2003  Job:  [id number]   cc:   Sharlet Salina, M.D.  [address]  [place]  [place] [address]  Fax: [phone]

## 2012-09-07 ENCOUNTER — Ambulatory Visit (INDEPENDENT_AMBULATORY_CARE_PROVIDER_SITE_OTHER): Payer: BC Managed Care – PPO | Admitting: Family Medicine

## 2012-09-07 ENCOUNTER — Encounter: Payer: Self-pay | Admitting: Family Medicine

## 2012-09-07 VITALS — BP 126/72 | HR 73 | Temp 97.7°F | Resp 16 | Ht 65.0 in | Wt 263.0 lb

## 2012-09-07 DIAGNOSIS — J019 Acute sinusitis, unspecified: Secondary | ICD-10-CM

## 2012-09-07 DIAGNOSIS — Z1239 Encounter for other screening for malignant neoplasm of breast: Secondary | ICD-10-CM

## 2012-09-07 DIAGNOSIS — Z0000 Encounter for general adult medical examination without abnormal findings: Secondary | ICD-10-CM

## 2012-09-07 LAB — CBC WITH DIFFERENTIAL/PLATELET
Basophils Absolute: 0 10*3/uL (ref 0.0–0.1)
Basophils Relative: 0.3 % (ref 0.0–3.0)
Eosinophils Absolute: 0.2 10*3/uL (ref 0.0–0.7)
HCT: 44.4 % (ref 36.0–46.0)
Hemoglobin: 14.8 g/dL (ref 12.0–15.0)
Lymphs Abs: 2.2 10*3/uL (ref 0.7–4.0)
Neutro Abs: 4 10*3/uL (ref 1.4–7.7)
Platelets: 231 10*3/uL (ref 150.0–400.0)
RBC: 5.39 Mil/uL — ABNORMAL HIGH (ref 3.87–5.11)
RDW: 14.1 % (ref 11.5–14.6)
WBC: 6.8 10*3/uL (ref 4.5–10.5)

## 2012-09-07 LAB — TSH: TSH: 0.69 u[IU]/mL (ref 0.35–5.50)

## 2012-09-07 LAB — BASIC METABOLIC PANEL
BUN: 17 mg/dL (ref 6–23)
GFR: 88.68 mL/min (ref >60.00)
Potassium: 3.9 mEq/L (ref 3.5–5.1)

## 2012-09-07 LAB — HEPATIC FUNCTION PANEL
AST: 22 U/L (ref 0–37)
Albumin: 4 g/dL (ref 3.5–5.2)
Alkaline Phosphatase: 98 U/L (ref 39–117)
Bilirubin, Direct: 0 mg/dL (ref 0.0–0.3)
Total Bilirubin: 0.4 mg/dL (ref 0.3–1.2)

## 2012-09-07 LAB — LIPID PANEL
Cholesterol: 149 mg/dL (ref 0–200)
LDL Cholesterol: 83 mg/dL (ref 0–99)
VLDL: 25.6 mg/dL (ref 0.0–40.0)

## 2012-09-07 MED ORDER — AZITHROMYCIN 250 MG PO TABS: ORAL_TABLET | ORAL | Status: DC

## 2012-09-07 NOTE — Assessment & Plan Note (Signed)
Due to duration of illness will start abx.  Reviewed supportive care and red flags that should prompt return.  Pt expressed understanding and is in agreement w/ plan.

## 2012-09-07 NOTE — Patient Instructions (Signed)
Follow up in 1 year or as needed Call Dr Aldona Bar and Dr Evette Cristal to set up appts [facility] call you with your mammo appt We'll notify you of your lab results Try and get regular exercise and make healthy food choices Take the Zpack as directed for the upper respiratory infectio Call with any questions or concerns Happy Holidays!!!

## 2012-09-07 NOTE — Progress Notes (Signed)
  Subjective:    Patient ID: Frances [name], female    DOB: 09/25/1971, 41 y.o.   MRN: [id number]  HPI CPE- overdue on mammo.  GYNAldona Bar.  S/p TAH-BSO (2003), took herself off HRT.  Mother w/ colon cancer, dx'd at age 25.  Had colonoscopy w/ Dr Evette Cristal 2004/06, received recall notice.  ? Sinus infxn- sxs started 4 weeks ago w/ nasal congestion, now w/ L sided facial pain and swelling.  No fevers.  L ear fullness.  Previously had cough productive of thick green sputum- this has cleared to yellow.  + sick contacts.  Thought things were improving but again not feeling well.   Review of Systems Patient reports no vision/ hearing changes, adenopathy,fever, weight change,  persistant/recurrent hoarseness , swallowing issues, chest pain, palpitations, edema, persistant/recurrent cough, hemoptysis, dyspnea (rest/exertional/paroxysmal nocturnal), gastrointestinal bleeding (melena, rectal bleeding), abdominal pain, significant heartburn, bowel changes, GU symptoms (dysuria, hematuria, incontinence), Gyn symptoms (abnormal  bleeding, pain),  syncope, focal weakness, memory loss, numbness & tingling, skin/hair/nail changes, abnormal bruising or bleeding, anxiety, or depression.     Objective:   Physical Exam  General Appearance:    Alert, cooperative, no distress, appears stated age, morbidly obese  Head:    Normocephalic, without obvious abnormality, atraumatic  Eyes:    PERRL, conjunctiva/corneas clear, EOM's intact, fundi    benign, both eyes  Ears:    Normal TM's and external ear canals, both ears  Nose:   Nares normal, septum midline, mucosal edema, + TTP over L maxillary sinus  Throat:   Lips, mucosa, and tongue normal; teeth and gums normal  Neck:   Supple, symmetrical, trachea midline, no adenopathy;    Thyroid: no enlargement/tenderness/nodules  Back:     Symmetric, no curvature, ROM normal, no CVA tenderness  Lungs:     Clear to auscultation bilaterally, respirations unlabored  Chest Wall:     No tenderness or deformity   Heart:    Regular rate and rhythm, S1 and S2 normal, no murmur, rub   or gallop  Breast Exam:    No tenderness, masses, or nipple abnormality  Abdomen:     Soft, non-tender, bowel sounds active all four quadrants,    no masses, no organomegaly  Genitalia:    Deferred to GYN  Rectal:    Extremities:   Extremities normal, atraumatic, no cyanosis or edema  Pulses:   2+ and symmetric all extremities  Skin:   Skin color, texture, turgor normal, no rashes or lesions  Lymph nodes:   Cervical, supraclavicular, and axillary nodes normal  Neurologic:   CNII-XII intact, normal strength, sensation and reflexes    throughout          Assessment & Plan:

## 2012-09-07 NOTE — Assessment & Plan Note (Signed)
Pt overdue on GI and GYN appts.  Encouraged her to schedule.  Will refer for mammo.  Check labs.  Anticipatory guidance provided.

## 2012-09-20 ENCOUNTER — Telehealth: Payer: Self-pay

## 2012-09-20 NOTE — Telephone Encounter (Signed)
Pt LMOVM states need clarity on what to take for Vit D level being low. Pt states Rx was for D3 but vit D2 is low need clarity before taking med. Plz advise pt  CB# [phone] until 4pm or [phone] Home

## 2012-09-20 NOTE — Telephone Encounter (Signed)
Tried to call Pt line busy will try again tomorrow.

## 2012-09-22 NOTE — Telephone Encounter (Signed)
Left message to call office

## 2012-09-26 NOTE — Telephone Encounter (Signed)
Left message to call office

## 2012-09-27 ENCOUNTER — Ambulatory Visit
Admission: RE | Admit: 2012-09-27 | Discharge: 2012-09-27 | Disposition: A | Discharge status: Home / Self Care | Payer: BC Managed Care – PPO | Source: Ambulatory Visit | Attending: Family Medicine | Admitting: Family Medicine

## 2012-09-27 DIAGNOSIS — Z1239 Encounter for other screening for malignant neoplasm of breast: Secondary | ICD-10-CM

## 2012-10-03 ENCOUNTER — Encounter: Payer: Self-pay | Admitting: *Deleted

## 2012-10-03 NOTE — Telephone Encounter (Signed)
Letter Mail, after several attempt to contact Pt.

## 2013-02-13 ENCOUNTER — Ambulatory Visit (INDEPENDENT_AMBULATORY_CARE_PROVIDER_SITE_OTHER): Payer: BC Managed Care – PPO | Admitting: Nurse Practitioner

## 2013-02-13 ENCOUNTER — Ambulatory Visit: Payer: BC Managed Care – PPO | Admitting: Family Medicine

## 2013-02-13 ENCOUNTER — Telehealth: Payer: Self-pay | Admitting: *Deleted

## 2013-02-13 ENCOUNTER — Encounter: Payer: Self-pay | Admitting: Nurse Practitioner

## 2013-02-13 VITALS — BP 124/86 | HR 62 | Temp 98.0°F | Ht 65.0 in | Wt 273.8 lb

## 2013-02-13 DIAGNOSIS — B029 Zoster without complications: Secondary | ICD-10-CM

## 2013-02-13 MED ORDER — HYDROCODONE-ACETAMINOPHEN 5-325 MG PO TABS: 1.0000 | ORAL_TABLET | Freq: Every evening | ORAL | Status: DC | PRN

## 2013-02-13 MED ORDER — FAMCICLOVIR 500 MG PO TABS: 500.0000 mg | ORAL_TABLET | Freq: Three times a day (TID) | ORAL | Status: DC

## 2013-02-13 NOTE — Progress Notes (Signed)
  Subjective:    Patient ID: Frances [name], female    DOB: 1971/01/31, 42 y.o.   MRN: [id number]  Rash This is a new problem. The current episode started in the past 7 days (noticed bumpy rash 5 days ago, became painful 3 days ago). The problem has been gradually worsening since onset. The affected locations include the left shoulder and left arm. The rash is characterized by blistering, pain, redness, swelling and burning. She was exposed to nothing. Associated symptoms include fatigue and joint pain (shoulder & arm hurt to elbow, where pain stops). Pertinent negatives include no congestion, cough, diarrhea, sore throat or vomiting. Past treatments include analgesics. The treatment provided mild relief. Her past medical history is significant for asthma and varicella.      Review of Systems  Constitutional: Positive for chills (felt "clammy" off & on 3 days ago) and fatigue.  HENT: Negative for congestion, sore throat, mouth sores and neck stiffness.   Respiratory: Negative for cough.   Gastrointestinal: Negative for nausea, vomiting, abdominal pain and diarrhea.  Musculoskeletal: Positive for joint pain (shoulder & arm hurt to elbow, where pain stops) and arthralgias (shoulder L & upper arm ache & axillae feel like sunburn).  Skin: Positive for rash.  Allergic/Immunologic: Negative for environmental allergies and food allergies.  Neurological: Negative for numbness and headaches.  Hematological: Negative for adenopathy.  Psychiatric/Behavioral: Positive for sleep disturbance (tossing & turning due to arm & shoulder discomfort).       Has had added "life" stress in last few months       Objective:   Physical Exam  Vitals reviewed. Constitutional: She is oriented to person, place, and time. She appears well-developed and well-nourished. No distress.  HENT:  Head: Normocephalic and atraumatic.  Eyes: Conjunctivae are normal.  Musculoskeletal: Normal range of motion. She exhibits  edema (slight edema L upper arm) and tenderness.  Neurological: She is alert and oriented to person, place, and time.  Skin: Skin is warm and dry. Rash (cluster of blisters on red base posterior L shoulder, approx 2 cm diamter, ovoid shaped) noted.  Psychiatric: She has a normal mood and affect. Her behavior is normal. Thought content normal.          Assessment & Plan:  Herpes Zoster: famciclovir, aveeno calamine & pramoxine lotion, ibuprophen as needed for discomfort, norco at bedtime as needed, watch for signs of secondary infection, return as needed. Avoid pregnant and immunocompromised contacts as discussed, and children who have not been vaccinated or had chicken pox.

## 2013-02-13 NOTE — Telephone Encounter (Signed)
Work excuse letter

## 2013-02-13 NOTE — Patient Instructions (Addendum)
I am treating for herpes zoster (shingles) with an antiviral. Keep rash clean-gentle soap & water, pat dry. Protect blisters from being scraped-cover if needed to prevent secondary infection. You may apply Aveeno calamine & pramoxine anti-itch cream as needed. Use 200-600 mg ibuprophen 3 times daily as needed for pain or 800 mg twice daily for a few days. You may use hydrocodone at bedtime if needed. Avoid pregnant and immunocompromised contacts until rash has resolved.

## 2013-02-20 ENCOUNTER — Telehealth: Payer: Self-pay | Admitting: *Deleted

## 2013-02-20 NOTE — Telephone Encounter (Signed)
Patient called the office with multiple questions concerning shingles that she was recently diagnosed with. 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 I advised that as long as she had open blisters or sores that were not yet healed over she needed to avoid patient with a compromised immune system such as babies, geriatric pts and pregnant women. Patient verbalized understanding with instructions given.

## 2013-03-27 ENCOUNTER — Ambulatory Visit: Payer: BC Managed Care – PPO | Admitting: Nurse Practitioner

## 2014-12-27 ENCOUNTER — Other Ambulatory Visit: Payer: Self-pay | Admitting: Nurse Practitioner

## 2014-12-27 ENCOUNTER — Telehealth: Payer: Self-pay | Admitting: Family Medicine

## 2014-12-27 DIAGNOSIS — J111 Influenza due to unidentified influenza virus with other respiratory manifestations: Secondary | ICD-10-CM

## 2014-12-27 MED ORDER — OSELTAMIVIR PHOSPHATE 75 MG PO CAPS: 75.0000 mg | ORAL_CAPSULE | Freq: Every day | ORAL | Status: DC

## 2014-12-27 NOTE — Progress Notes (Signed)
Patient aware that med was sent into pharmacy and she was also made aware of side effects.

## 2014-12-27 NOTE — Telephone Encounter (Signed)
Patient's son was tested positive for Type A flu.  Pt is not your patient but she says that she saw you last and was wondering if you could send Rx for tamiflu as preventative dose.  Please advise.

## 2019-12-23 ENCOUNTER — Ambulatory Visit: Payer: Self-pay | Attending: Internal Medicine

## 2019-12-23 DIAGNOSIS — Z23 Encounter for immunization: Secondary | ICD-10-CM | POA: Insufficient documentation

## 2019-12-23 NOTE — Progress Notes (Signed)
   Covid-19 Vaccination Clinic  Name:  Frances [name]    MRN: [id number] DOB: 1971-03-05  12/23/2019  [name] was observed post Covid-19 immunization for 15 minutes without incident. She was provided with Vaccine Information Sheet and instruction to access the V-Safe system.   [name] was instructed to call 911 with any severe reactions post vaccine: Marland Kitchen Difficulty breathing  . Swelling of face and throat  . A fast heartbeat  . A bad rash all over body  . Dizziness and weakness   Immunizations Administered    Name Date Dose VIS Date Route   Pfizer COVID-19 Vaccine 12/23/2019  1:43 PM 0.3 mL 09/28/2019 Intramuscular   Manufacturer: [place]   Lot: [id number]   [place]: [id number]

## 2020-01-13 ENCOUNTER — Ambulatory Visit: Payer: Self-pay | Attending: Internal Medicine

## 2020-01-13 DIAGNOSIS — Z23 Encounter for immunization: Secondary | ICD-10-CM

## 2020-01-13 NOTE — Progress Notes (Signed)
   Covid-19 Vaccination Clinic  Name:  Frances [name]    MRN: [id number] DOB: [birth year]  01/13/2020  [name] was observed post Covid-19 immunization for 15 minutes without incident. She was provided with Vaccine Information Sheet and instruction to access the V-Safe system.   [name] was instructed to call 911 with any severe reactions post vaccine: Marland Kitchen Difficulty breathing  . Swelling of face and throat  . A fast heartbeat  . A bad rash all over body  . Dizziness and weakness   Immunizations Administered    Name Date Dose VIS Date Route   Pfizer COVID-19 Vaccine 01/13/2020 10:55 AM 0.3 mL 09/28/2019 Intramuscular   Manufacturer: [place]   Lot: [id number]   [place]: [id number]

## 2020-08-11 ENCOUNTER — Telehealth: Payer: Self-pay | Admitting: Family Medicine

## 2020-08-11 NOTE — Telephone Encounter (Signed)
Please advise 

## 2020-08-11 NOTE — Telephone Encounter (Signed)
Pt called in asking if you would take her back as a new pt? Last visit was 2013  Please advise   Pt can be reached at the home #

## 2020-08-12 NOTE — Telephone Encounter (Signed)
I have LM making pt aware.

## 2020-08-12 NOTE — Telephone Encounter (Signed)
Ok to re-establish 

## 2020-09-09 ENCOUNTER — Ambulatory Visit (INDEPENDENT_AMBULATORY_CARE_PROVIDER_SITE_OTHER): Payer: BC Managed Care – PPO | Admitting: Family Medicine

## 2020-09-09 ENCOUNTER — Encounter: Payer: Self-pay | Admitting: Family Medicine

## 2020-09-09 ENCOUNTER — Other Ambulatory Visit: Payer: Self-pay

## 2020-09-09 DIAGNOSIS — Z23 Encounter for immunization: Secondary | ICD-10-CM | POA: Diagnosis not present

## 2020-09-09 DIAGNOSIS — Z1231 Encounter for screening mammogram for malignant neoplasm of breast: Secondary | ICD-10-CM

## 2020-09-09 DIAGNOSIS — Z1159 Encounter for screening for other viral diseases: Secondary | ICD-10-CM

## 2020-09-09 DIAGNOSIS — M25561 Pain in right knee: Secondary | ICD-10-CM | POA: Diagnosis not present

## 2020-09-09 DIAGNOSIS — F439 Reaction to severe stress, unspecified: Secondary | ICD-10-CM

## 2020-09-09 DIAGNOSIS — Z1211 Encounter for screening for malignant neoplasm of colon: Secondary | ICD-10-CM

## 2020-09-09 DIAGNOSIS — Z114 Encounter for screening for human immunodeficiency virus [HIV]: Secondary | ICD-10-CM

## 2020-09-09 LAB — CBC WITH DIFFERENTIAL/PLATELET
Basophils Absolute: 0.1 10*3/uL (ref 0.0–0.1)
Basophils Relative: 0.7 % (ref 0.0–3.0)
Eosinophils Absolute: 0.2 10*3/uL (ref 0.0–0.7)
Eosinophils Relative: 2 % (ref 0.0–5.0)
HCT: 45.3 % (ref 36.0–46.0)
Hemoglobin: 15.2 g/dL — ABNORMAL HIGH (ref 12.0–15.0)
Lymphocytes Relative: 28.2 % (ref 12.0–46.0)
Lymphs Abs: 2.2 10*3/uL (ref 0.7–4.0)
MCHC: 33.5 g/dL (ref 30.0–36.0)
MCV: 82.7 fl (ref 78.0–100.0)
Monocytes Absolute: 0.5 10*3/uL (ref 0.1–1.0)
Monocytes Relative: 6 % (ref 3.0–12.0)
Neutro Abs: 4.8 10*3/uL (ref 1.4–7.7)
Neutrophils Relative %: 63.1 % (ref 43.0–77.0)
Platelets: 228 10*3/uL (ref 150.0–400.0)
RBC: 5.48 Mil/uL — ABNORMAL HIGH (ref 3.87–5.11)
RDW: 14.1 % (ref 11.5–15.5)
WBC: 7.6 10*3/uL (ref 4.0–10.5)

## 2020-09-09 LAB — HEPATIC FUNCTION PANEL
ALT: 15 U/L (ref 0–35)
AST: 14 U/L (ref 0–37)
Albumin: 4.3 g/dL (ref 3.5–5.2)
Alkaline Phosphatase: 92 U/L (ref 39–117)
Bilirubin, Direct: 0.1 mg/dL (ref 0.0–0.3)
Total Bilirubin: 0.6 mg/dL (ref 0.2–1.2)
Total Protein: 7.1 g/dL (ref 6.0–8.3)

## 2020-09-09 LAB — LIPID PANEL
Cholesterol: 141 mg/dL (ref 0–200)
HDL: 58.6 mg/dL
LDL Cholesterol: 65 mg/dL (ref 0–99)
NonHDL: 82.14
Total CHOL/HDL Ratio: 2
Triglycerides: 84 mg/dL (ref 0.0–149.0)
VLDL: 16.8 mg/dL (ref 0.0–40.0)

## 2020-09-09 LAB — BASIC METABOLIC PANEL WITH GFR
BUN: 15 mg/dL (ref 6–23)
CO2: 27 meq/L (ref 19–32)
Calcium: 9.4 mg/dL (ref 8.4–10.5)
Chloride: 102 meq/L (ref 96–112)
Creatinine, Ser: 0.71 mg/dL (ref 0.40–1.20)
GFR: 99.39 mL/min
Glucose, Bld: 90 mg/dL (ref 70–99)
Potassium: 4.2 meq/L (ref 3.5–5.1)
Sodium: 138 meq/L (ref 135–145)

## 2020-09-09 LAB — TSH: TSH: 0.93 u[IU]/mL (ref 0.35–4.50)

## 2020-09-09 LAB — HEMOGLOBIN A1C: Hgb A1c MFr Bld: 5.4 % (ref 4.6–6.5)

## 2020-09-09 MED ORDER — MELOXICAM 15 MG PO TABS
15.0000 mg | ORAL_TABLET | Freq: Every day | ORAL | 1 refills | Status: DC
Start: 2020-09-09 — End: 2024-07-02

## 2020-09-09 NOTE — Addendum Note (Signed)
Addended by: Fritz Pickerel on: 09/09/2020 03:27 PM   Modules accepted: Orders

## 2020-09-09 NOTE — Assessment & Plan Note (Signed)
Pt's BMI is currently 46.24 and weight is 269.  But this is down from her max of 294.  She reports she has found success with small changes in diet and she is hoping to make more changes for even better results.  Applauded her efforts and encouraged her to continue.  Check labs to risk stratify.  Will follow.

## 2020-09-09 NOTE — Progress Notes (Signed)
Subjective:    Patient ID: Frances [name], female    DOB: 28-Jul-1971, 49 y.o.   MRN: [id number]  HPI Pt to re-establish.  Has not been seen by PCP since 2013.  'we're overdue for everything'- due for mammo, due to start colon cancer screening (overdue b/c mom had colon cancer). Due for Tdap.    UTD on COVID- plans to get booster.   UTD on flu shot  Increased stress- father in law on Hospice, difficult family situation.  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  Pt denies depression at this time- not tearful, no difficulty sleeping.  Morbid obesity- in 2019 pt was as high as 294 lbs.  She is currently 269 lbs.  Has made 'small changes'-which has resulted in weight loss.  Has cut out cereal in place of oatmeal, eliminate evening snacking, moved dinner earlier, increased water intake.  Wants to add walking but having knee pain.  No CP, SOB, abd pain, N/V, edema.   R knee- pt reports 'it's always had that little crunchy sound'.  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  Will have pain w/ rising to a standing position.  At times knee feels like it will give out when trying to turn.  Extremely painful going down stairs.  Gait has changed.  Pain is localized to inferior patella but will have pain that travels up laterally along IT band and medially.  Occasionally will have burning pain.  'it feels like something is rubbing on top of each other'.  Some mild swelling- particularly w/ prolonged standing.   Review of Systems For ROS see HPI   This visit occurred during the SARS-CoV-2 public health emergency.  Safety protocols were in place, including screening questions prior to the visit, additional usage of staff PPE, and extensive cleaning of exam room while observing appropriate contact time as indicated for disinfecting solutions.       Objective:   Physical Exam Vitals reviewed.  Constitutional:      General: She is not in acute distress.    Appearance: She is  well-developed. She is obese. She is not ill-appearing.  HENT:     Head: Normocephalic and atraumatic.  Eyes:     Conjunctiva/sclera: Conjunctivae normal.     Pupils: Pupils are equal, round, and reactive to light.  Neck:     Thyroid: No thyromegaly.  Cardiovascular:     Rate and Rhythm: Normal rate and regular rhythm.     Heart sounds: Normal heart sounds. No murmur heard.   Pulmonary:     Effort: Pulmonary effort is normal. No respiratory distress.     Breath sounds: Normal breath sounds.  Abdominal:     General: There is no distension.     Palpations: Abdomen is soft.     Tenderness: There is no abdominal tenderness.  Musculoskeletal:        General: Tenderness (TTP along inferior patella, TTP over IT banc insertion) present.     Cervical back: Normal range of motion and neck supple.  Lymphadenopathy:     Cervical: No cervical adenopathy.  Skin:    General: Skin is warm and dry.  Neurological:     Mental Status: She is alert and oriented to person, place, and time.  Psychiatric:        Behavior: Behavior normal.           Assessment & Plan:  Stress- new.  Pt has home stress w/ her father-in-law  in Hospice, work stress working in [employer], husband needs surgery upcoming.  She feels that she currently has a handle on things and is aware when she needs to take a step back to recharge.  She does not feel that medications are necessary at this time.  Will follow.  R anterior knee pain- new.  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  Since then she has had pain along the inferior edge of her patella and radiating to both sides of her distal thigh.  Will start Meloxicam once daily for symptom relief while we get her to see Ortho for a complete evaluation.  Pt expressed understanding and is in agreement w/ plan.

## 2020-09-09 NOTE — Patient Instructions (Signed)
Schedule your complete physical in 6 months We'll notify you of your lab results and make any changes if needed We'll call you with your GI referral, Ortho referral, mammogram appt Start the Meloxicam once daily- take w/ food Make sure you take care of you and continue to manage your stress! Continue to work on healthy diet and exercise as able Call with any questions or concerns Stay Safe!  Stay Healthy! Happy Thanksgiving! Welcome Back!!!

## 2020-09-12 LAB — HEPATITIS C ANTIBODY
Hepatitis C Ab: NONREACTIVE
SIGNAL TO CUT-OFF: 0.01 (ref <1.00)

## 2020-09-12 LAB — HIV ANTIBODY (ROUTINE TESTING W REFLEX): HIV 1&2 Ab, 4th Generation: NONREACTIVE

## 2020-09-15 ENCOUNTER — Telehealth: Payer: Self-pay | Admitting: Family Medicine

## 2020-09-15 NOTE — Telephone Encounter (Signed)
Please call patient back - she said she is returning a call from last week - she thinks it is concerning her blood work from 09/09/2020

## 2020-09-22 ENCOUNTER — Ambulatory Visit (INDEPENDENT_AMBULATORY_CARE_PROVIDER_SITE_OTHER): Payer: BC Managed Care – PPO | Admitting: Orthopaedic Surgery

## 2020-09-22 ENCOUNTER — Other Ambulatory Visit: Payer: Self-pay

## 2020-09-22 ENCOUNTER — Encounter: Payer: Self-pay | Admitting: Orthopaedic Surgery

## 2020-09-22 ENCOUNTER — Ambulatory Visit: Payer: Self-pay

## 2020-09-22 DIAGNOSIS — G8929 Other chronic pain: Secondary | ICD-10-CM

## 2020-09-22 DIAGNOSIS — M25561 Pain in right knee: Secondary | ICD-10-CM

## 2020-09-22 NOTE — Progress Notes (Signed)
Office Visit Note   Patient: Frances [name]           Date of Birth: 05/06/1971           MRN: [id number] Visit Date: 09/22/2020              Requested by: Midge Minium, MD [address] [place],  [place] [address] PCP: Midge Minium, MD   Assessment & Plan: Visit Diagnoses:  1. Right knee pain, unspecified chronicity   2. Chronic pain of right knee     Plan: I have recommended outpatient physical therapy for her knees to see if there is any modalities that therapist can do to help decrease her IT band pain as well as strengthen the quads.  Of also recommended a topical anti-inflammatory such as Voltaren gel.  I have advocated weight loss and keeping her exercise walking routine on a flat surface.  All questions and concerns were answered and addressed.  She will continue meloxicam as needed.  We will work on setting up outpatient physical therapy for her knees.  We can see her back in 6 weeks to see how she is doing overall.  Follow-Up Instructions: Return in about 6 weeks (around 11/03/2020).   Orders:  Orders Placed This Encounter  Procedures  . XR Knee 1-2 Views Right   No orders of the defined types were placed in this encounter.     Procedures: No procedures performed   Clinical Data: No additional findings.   Subjective: Chief Complaint  Patient presents with  . Right Knee - Pain  The patient is a very pleasant 49 year old female who comes in with a 1 year history of right knee pain.  She points the lateral aspect of her knee and patella tendon area as a source of her pain but also the IT band.  She says both IT bands feel "knotted".  She denies any injury.  She likes to go on walks for exercise.  She is never had surgery on her knee.  She denies any locking catching.  She has been on meloxicam recently that has helped some.  A lot of her pain is going up and down stairs.  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  She denies any acute  change in medical status.  She is not a diabetic.  Her last BMI calculation was 46.  HPI  Review of Systems There is currently no headache, chest pain, shortness of breath, fever, chills, nausea, vomiting  Objective: Vital Signs: There were no vitals taken for this visit.  Physical Exam She is alert and oriented x3 and in no acute distress Ortho Exam Examination of her right knee and left knee shows a both hyperextend.  Neither knee has an effusion.  Both knees are ligamentously stable.  The right knee does have pain over the lateral joint line and IT band.  There is no significant patellofemoral crepitation.  The right knee is ligamentously stable and her Lachman's and McMurray's exams are negative Specialty Comments:  No specialty comments available.  Imaging: XR Knee 1-2 Views Right  Result Date: 09/22/2020 2 views of the right knee show no acute findings.    PMFS History: Patient Active Problem List   Diagnosis Date Noted  . Morbidly obese ([place]) 09/09/2020  . Routine general medical examination at a health care facility 09/07/2012  . ASTHMA 01/10/2009  . DERMATITIS, SEBORRHEIC NOS 05/11/2007   Past Medical History:  Diagnosis Date  . Asthma     Family History  Problem Relation Age of Onset  . Cancer Mother        colon  . Diabetes Mother        type 2  . Cancer Father   . Cancer Maternal Grandmother   . Diabetes Maternal Grandmother     Past Surgical History:  Procedure Laterality Date  . ABDOMINAL HYSTERECTOMY    . APPENDECTOMY  05/1997   RIGHT OVARIE,   . LAPAROSCOPIC CHOLECYSTECTOMY  02/2004   Social History   Occupational History  . Not on file  Tobacco Use  . Smoking status: Never Smoker  . Smokeless tobacco: Never Used  Substance and Sexual Activity  . Alcohol use: Not Currently  . Drug use: Never  . Sexual activity: Yes    Birth control/protection: None

## 2020-09-25 ENCOUNTER — Ambulatory Visit (HOSPITAL_COMMUNITY): Payer: BC Managed Care – PPO | Attending: Orthopaedic Surgery | Admitting: Physical Therapy

## 2020-09-25 ENCOUNTER — Other Ambulatory Visit: Payer: Self-pay

## 2020-09-25 ENCOUNTER — Encounter (HOSPITAL_COMMUNITY): Payer: Self-pay | Admitting: Physical Therapy

## 2020-09-25 DIAGNOSIS — M25561 Pain in right knee: Secondary | ICD-10-CM | POA: Diagnosis present

## 2020-09-25 DIAGNOSIS — R2689 Other abnormalities of gait and mobility: Secondary | ICD-10-CM | POA: Insufficient documentation

## 2020-09-25 NOTE — Addendum Note (Signed)
Addended by: Josue Hector A on: 09/25/2020 04:46 PM   Modules accepted: Orders

## 2020-09-25 NOTE — Patient Instructions (Signed)
Access Code: [email or web address] URL: [email or web address] Date: 09/25/2020 Prepared by: Josue Hector  Exercises Supine Quad Set - 2-3 x daily - 7 x weekly - 2 sets - 10 reps - 5 sec hold Active Straight Leg Raise with Quad Set - 2-3 x daily - 7 x weekly - 2 sets - 10 reps Supine Heel Slide with Strap - 2-3 x daily - 7 x weekly - 2 sets - 10 reps - 5 sec hold Prone Quadriceps Stretch with Strap - 2-3 x daily - 7 x weekly - 1 sets - 10 reps - 10 sec hold

## 2020-09-25 NOTE — Therapy (Signed)
Frances [name], [name], [address] Phone: [phone]   Fax:  [phone]  Physical Therapy Evaluation  Patient Details  Name: Frances [name] MRN: [id number] Date of Birth: 1970/10/29 Referring Provider (PT): Jean Rosenthal MD   Encounter Date: 09/25/2020   PT End of Session - 09/25/20 1635    Visit Number 1    Number of Visits 8    Date for PT Re-Evaluation 10/24/20    Authorization Type BCBS    PT Start Time 1525    PT Stop Time 1620    PT Time Calculation (min) 55 min    Activity Tolerance Patient tolerated treatment well    Behavior During Therapy [facility] for tasks assessed/performed           Past Medical History:  Diagnosis Date  . Asthma     Past Surgical History:  Procedure Laterality Date  . ABDOMINAL HYSTERECTOMY    . APPENDECTOMY  05/1997   RIGHT OVARIE,   . LAPAROSCOPIC CHOLECYSTECTOMY  02/2004    There were no vitals filed for this visit.    Subjective Assessment - 09/25/20 1541    Subjective Patient presents to physical therapy with complaint of RT knee pain. Patient says she could hear her knee crunching for some time, but pain has began and become progressively worse in past 1 year. She says driving and sitting at school seem to cause more pain and stiffness, especially when she begins to stand form seated position. Patient says she noticed this has affected her gait. Patient says knee feels unstable at times, especially when turning toward LT. Patient had recent xrays which showed no acute findings. She is taking meloxicam which she says has been helpful.    Limitations Lifting;Standing;Walking;House hold activities    Diagnostic tests xrays    Patient Stated Goals have normal gait, walk longer distance, decrease    Currently in Pain? Yes    Pain Score 3     Pain Location Knee    Pain Orientation Right;Anterior    Pain Descriptors / Indicators Burning;Aching;Tightness;Throbbing     Pain Type Acute pain    Pain Onset Other (comment)    Pain Frequency Constant    Aggravating Factors  standing, walking, bending, stairs    Pain Relieving Factors ice, rest, non WB, DF RT foot .    Effect of Pain on Daily Activities Limits              [facility] PT Assessment - 09/25/20 0001      Assessment   Medical Diagnosis RT knee pain    Referring Provider (PT) Jean Rosenthal MD    Onset Date/Surgical Date --   ~ 1 year   Next MD Visit 11/10/20    Prior Therapy No      Precautions   Precautions None      Restrictions   Weight Bearing Restrictions No      Balance Screen   Has the patient fallen in the past 6 months No      [place]    Living Arrangements Spouse/significant other;Children      Prior Function   Level of Independence Independent      Cognition   Overall Cognitive Status Within Functional Limits for tasks assessed      Observation/Other Assessments   Focus on Therapeutic Outcomes (FOTO)  37% limited      ROM / Strength   AROM /  PROM / Strength AROM;Strength      AROM   AROM Assessment Site Knee    Right/Left Knee Right;Left    Right Knee Extension 0    Right Knee Flexion 120    Left Knee Extension 0    Left Knee Flexion 130      Strength   Strength Assessment Site Hip;Knee;Ankle    Right/Left Hip Right;Left    Right Hip Flexion 5/5    Right Hip Extension 4+/5    Right Hip ABduction 4-/5    Left Hip Flexion 5/5    Left Hip Extension 4+/5    Left Hip ABduction 4/5    Right/Left Knee Right;Left    Right Knee Flexion 5/5    Right Knee Extension 4/5    Left Knee Flexion 5/5    Left Knee Extension 5/5    Right/Left Ankle Right;Left    Right Ankle Dorsiflexion 5/5    Left Ankle Dorsiflexion 5/5      Flexibility   Soft Tissue Assessment /Muscle Length --   min restriciton in RT quad flexibility     Palpation   Patella mobility Mod restriction med/ lat    Palpation comment TTP about  RT patellar tendon and      Ambulation/Gait   Ambulation/Gait Yes    Gait Pattern Decreased stance time - right;Decreased step length - left   early toe off on RT   Ambulation Surface Level;Indoor                      Objective measurements completed on examination: See above findings.       [facility] Adult PT Treatment/Exercise - 09/25/20 0001      Exercises   Exercises Knee/Hip      Knee/Hip Exercises: Stretches   Teacher, adult education Limitations prone with strap      Knee/Hip Exercises: Supine   Quad Sets Right;10 reps    Heel Slides Right;5 reps    Straight Leg Raises Right;10 reps                  PT Education - 09/25/20 1547    Education Details on evaluation findings, POC and HEP    Person(s) Educated Patient    Methods Explanation;Handout    Comprehension Verbalized understanding            PT Short Term Goals - 09/25/20 1640      PT SHORT TERM GOAL #1   Title Patient will be independent with initial HEP and self-management strategies to improve functional outcomes    Time 2    Period Weeks    Status New    Target Date 10/10/20             PT Long Term Goals - 09/25/20 1640      PT LONG TERM GOAL #1   Title Patient will improve FOTO score by 10% to indicate improvement in functional outcomes    Time 4    Period Weeks    Status New    Target Date 10/24/20      PT LONG TERM GOAL #2   Title Patient will report at least 80% overall improvement in subjective complaint to indicate improvement in ability to perform ADLs.    Time 4    Period Weeks    Status New    Target Date 10/24/20      PT LONG TERM GOAL #3   Title Patient  will have RT knee AROM 0-125 degrees to improve functional mobility and facilitate squatting to pick up items from floor.    Time 4    Period Weeks    Status New    Target Date 10/24/20      PT LONG TERM GOAL #4   Title Patient will be independent with final HEP and self-management  strategies to improve functional outcomes    Time 4    Period Weeks    Status New    Target Date 10/24/20                  Plan - 09/25/20 1636    Clinical Impression Statement Patient is a 49 y.o. female who presents to physical therapy with complaint of RT knee pain. Patient demonstrates decreased strength, ROM restriction, flexibility restriction and gait abnormalities which are likely contributing to symptoms of pain and are negatively impacting patient ability to perform ADLs and functional mobility tasks. Patient will benefit from skilled physical therapy services to address these deficits to reduce pain and improve level of function with ADLs and functional mobility tasks    Examination-Activity Limitations Stand;Stairs;Locomotion Level;Transfers    Examination-Participation Restrictions Occupation;Community Activity;Yard Work    Stability/Clinical Decision Making Stable/Uncomplicated    Designer, jewellery Low    Rehab Potential Good    PT Frequency 2x / week    PT Duration 4 weeks    PT Treatment/Interventions ADLs/Self Care Home Management;Aquatic Therapy;Biofeedback;Cryotherapy;Parrafin;Fluidtherapy;Therapeutic activities;Functional mobility training;Ultrasound;Traction;Cognitive remediation;Patient/family education;Manual lymph drainage;Manual techniques;Dry needling;Splinting;Visual/perceptual remediation/compensation;Passive range of motion;Vasopneumatic Device;Scar mobilization;Compression bandaging;Taping;Joint Manipulations;Orthotic Fit/Training;Therapeutic exercise;Contrast Bath;Electrical Stimulation;DME Instruction;Iontophoresis 4mg /ml Dexamethasone;Balance training;Gait training;Stair training;Neuromuscular re-education;Moist Heat;Spinal Manipulations;Energy conservation    PT Next Visit Plan Review goals and HEP. Progress with focus on RT quad and patellar mobility. Progress RT hip and knee strength as tolerated with focus on quad and hip abduction. Manual for  patellar mobility. Show ITB table stretch.    PT Home Exercise Plan Eval: quad set, SLR, heel slide with strap, prone quad stretch    Consulted and Agree with Plan of Care Patient           Patient will benefit from skilled therapeutic intervention in order to improve the following deficits and impairments:  Abnormal gait,Pain,Improper body mechanics,Increased fascial restricitons,Impaired flexibility,Difficulty walking,Decreased activity tolerance,Decreased range of motion,Decreased strength,Hypomobility  Visit Diagnosis: Right knee pain, unspecified chronicity  Other abnormalities of gait and mobility     Problem List Patient Active Problem List   Diagnosis Date Noted  . Morbidly obese ([place]) 09/09/2020  . Routine general medical examination at a health care facility 09/07/2012  . ASTHMA 01/10/2009  . DERMATITIS, SEBORRHEIC NOS 05/11/2007   4:44 PM, 09/25/20 Josue Hector PT DPT  Physical Therapist with [facility]  [phone] [place] [address] [place], [name], [address] Phone: [phone]   Fax:  [phone]  Name: KERRA GUILFOIL MRN: [id number] Date of Birth: 11-20-1970

## 2020-10-01 ENCOUNTER — Encounter (HOSPITAL_COMMUNITY): Payer: Self-pay | Admitting: Physical Therapy

## 2020-10-01 ENCOUNTER — Other Ambulatory Visit: Payer: Self-pay

## 2020-10-01 ENCOUNTER — Ambulatory Visit (HOSPITAL_COMMUNITY): Payer: BC Managed Care – PPO | Admitting: Physical Therapy

## 2020-10-01 DIAGNOSIS — R2689 Other abnormalities of gait and mobility: Secondary | ICD-10-CM

## 2020-10-01 DIAGNOSIS — M25561 Pain in right knee: Secondary | ICD-10-CM

## 2020-10-01 NOTE — Patient Instructions (Signed)
Access Code: [email or web address] URL: [email or web address] Date: 10/01/2020 Prepared by: Josue Hector  Exercises Supine Bridge - 2-3 x daily - 7 x weekly - 2 sets - 10 reps - 5 seconds hold Supine Hamstring Stretch - 2-3 x daily - 7 x weekly - 1 sets - 5 reps - 10 seconds hold Standing Gastroc Stretch - 2-3 x daily - 7 x weekly - 1 sets - 3 reps - 30 second hold

## 2020-10-02 NOTE — Progress Notes (Signed)
   10/01/20 0001  Knee/Hip Exercises: Stretches  Active Hamstring Stretch Right;5 reps;10 seconds  Gastroc Stretch Right;3 reps;30 seconds  Gastroc Stretch Limitations with strap  Knee/Hip Exercises: Standing  Heel Raises Both;1 set;15 reps  Knee/Hip Exercises: Supine  Quad Sets Right;15 reps  Heel Slides Right;15 reps  Straight Leg Raises Right;15 reps  Quad Sets Limitations 5" hold  Heel Slides Limitations 5"  Bridges 1 set;15 reps  Bridges Limitations 5"

## 2020-10-02 NOTE — Therapy (Signed)
[place], [name], [address] Phone: [phone]   Fax:  [phone]  Physical Therapy Treatment  Patient Details  Name: Frances [name] MRN: [id number] Date of Birth: 1971-05-05 Referring Provider (PT): Jean Rosenthal MD   Encounter Date: 10/01/2020   PT End of Session - 10/01/20 1531    Visit Number 2    Number of Visits 8    Date for PT Re-Evaluation 10/24/20    Authorization Type BCBS    PT Start Time 1525    PT Stop Time 1610    PT Time Calculation (min) 45 min    Activity Tolerance Patient tolerated treatment well    Behavior During Therapy [facility] for tasks assessed/performed           Past Medical History:  Diagnosis Date  . Asthma     Past Surgical History:  Procedure Laterality Date  . ABDOMINAL HYSTERECTOMY    . APPENDECTOMY  05/1997   RIGHT OVARIE,   . LAPAROSCOPIC CHOLECYSTECTOMY  02/2004    There were no vitals filed for this visit.   Subjective Assessment - 10/01/20 1529    Subjective Patient reports comaplince with HEP. Says she did not realize how weak she is. Says straight leg raises were tough due to weakness.    Limitations Lifting;Standing;Walking;House hold activities    Diagnostic tests xrays    Patient Stated Goals have normal gait, walk longer distance, decrease    Currently in Pain? Yes    Pain Score 4     Pain Location Knee    Pain Orientation Right;Anterior    Pain Descriptors / Indicators Burning;Tightness    Pain Type Acute pain    Pain Onset Other (comment)    Pain Frequency Constant             10/01/20 0001  Knee/Hip Exercises: Stretches  Active Hamstring Stretch Right;5 reps;10 seconds  Gastroc Stretch Right;3 reps;30 seconds  Gastroc Stretch Limitations with strap  Knee/Hip Exercises: Standing  Heel Raises Both;1 set;15 reps  Heel Raises Limitations toe raises x 10  Knee/Hip Exercises: Supine  Quad Sets Right;15 reps  Heel Slides Right;15  reps  Straight Leg Raises Right;15 reps  Quad Sets Limitations 5" hold  Heel Slides Limitations 5"  Bridges 1 set;15 reps  Bridges Limitations 5"      PT Short Term Goals - 09/25/20 1640      PT SHORT TERM GOAL #1   Title Patient will be independent with initial HEP and self-management strategies to improve functional outcomes    Time 2    Period Weeks    Status New    Target Date 10/10/20             PT Long Term Goals - 09/25/20 1640      PT LONG TERM GOAL #1   Title Patient will improve FOTO score by 10% to indicate improvement in functional outcomes    Time 4    Period Weeks    Status New    Target Date 10/24/20      PT LONG TERM GOAL #2   Title Patient will report at least 80% overall improvement in subjective complaint to indicate improvement in ability to perform ADLs.    Time 4    Period Weeks    Status New    Target Date 10/24/20      PT LONG TERM GOAL #3   Title Patient will have RT knee AROM 0-125  degrees to improve functional mobility and facilitate squatting to pick up items from floor.    Time 4    Period Weeks    Status New    Target Date 10/24/20      PT LONG TERM GOAL #4   Title Patient will be independent with final HEP and self-management strategies to improve functional outcomes    Time 4    Period Weeks    Status New    Target Date 10/24/20                 Plan - 10/02/20 1424    Clinical Impression Statement Patient tolerated session well today. Reviewed therapy goals and HEP. Patient shows good return with HEP exercise. Initiated ther ex and progress hip and knee strengthening exercise. Patient educated on proper form and function of all added exercise. Patient cued on proper foot placement with hip bridging. Patient did note some discomfort in anterior knee during heel raises, but decreased with toe raises. Issued patient updated HEP handout. Patient will continue to benefit from skilled therapy services to progress knee strength  and mobility to reduce pain and improve LOF with ADLs.    Examination-Activity Limitations Stand;Stairs;Locomotion Level;Transfers    Examination-Participation Restrictions Occupation;Community Activity;Yard Work    Stability/Clinical Decision Making Stable/Uncomplicated    Rehab Potential Good    PT Frequency 2x / week    PT Duration 4 weeks    PT Treatment/Interventions ADLs/Self Care Home Management;Aquatic Therapy;Biofeedback;Cryotherapy;Parrafin;Fluidtherapy;Therapeutic activities;Functional mobility training;Ultrasound;Traction;Cognitive remediation;Patient/family education;Manual lymph drainage;Manual techniques;Dry needling;Splinting;Visual/perceptual remediation/compensation;Passive range of motion;Vasopneumatic Device;Scar mobilization;Compression bandaging;Taping;Joint Manipulations;Orthotic Fit/Training;Therapeutic exercise;Contrast Bath;Electrical Stimulation;DME Instruction;Iontophoresis 4mg /ml Dexamethasone;Balance training;Gait training;Stair training;Neuromuscular re-education;Moist Heat;Spinal Manipulations;Energy conservation    PT Next Visit Plan Progress with focus on RT quad and patellar mobility. Progress RT hip and knee strength as tolerated with focus on quad and hip abduction. Manual for patellar mobility. Show ITB table stretch.    PT Home Exercise Plan Eval: quad set, SLR, heel slide with strap, prone quad stretch 10/01/20: bridge, heel raise, calf stretch    Consulted and Agree with Plan of Care Patient           Patient will benefit from skilled therapeutic intervention in order to improve the following deficits and impairments:  Abnormal gait,Pain,Improper body mechanics,Increased fascial restricitons,Impaired flexibility,Difficulty walking,Decreased activity tolerance,Decreased range of motion,Decreased strength,Hypomobility  Visit Diagnosis: Right knee pain, unspecified chronicity  Other abnormalities of gait and mobility     Problem List Patient Active  Problem List   Diagnosis Date Noted  . Morbidly obese ([place]) 09/09/2020  . Routine general medical examination at a health care facility 09/07/2012  . ASTHMA 01/10/2009  . DERMATITIS, SEBORRHEIC NOS 05/11/2007   2:33 PM, 10/02/20 Josue Hector PT DPT  Physical Therapist with [facility]  [phone] [place] [address] [place], [name], [address] Phone: [phone]   Fax:  [phone]  Name: DEVYNNE STURDIVANT MRN: [id number] Date of Birth: 06/09/1971

## 2020-10-02 NOTE — Progress Notes (Signed)
   10/01/20 0001  Knee/Hip Exercises: Stretches  Active Hamstring Stretch Right;5 reps;10 seconds  Gastroc Stretch Right;3 reps;30 seconds  Gastroc Stretch Limitations with strap  Knee/Hip Exercises: Standing  Heel Raises Both;1 set;15 reps  Heel Raises Limitations toe raises x 10  Knee/Hip Exercises: Supine  Quad Sets Right;15 reps  Heel Slides Right;15 reps  Straight Leg Raises Right;15 reps  Quad Sets Limitations 5" hold  Heel Slides Limitations 5"  Bridges 1 set;15 reps  Bridges Limitations 5"

## 2020-10-03 ENCOUNTER — Other Ambulatory Visit: Payer: Self-pay

## 2020-10-03 ENCOUNTER — Ambulatory Visit (HOSPITAL_COMMUNITY): Payer: BC Managed Care – PPO | Admitting: Physical Therapy

## 2020-10-03 ENCOUNTER — Encounter (HOSPITAL_COMMUNITY): Payer: Self-pay | Admitting: Physical Therapy

## 2020-10-03 DIAGNOSIS — R2689 Other abnormalities of gait and mobility: Secondary | ICD-10-CM

## 2020-10-03 DIAGNOSIS — M25561 Pain in right knee: Secondary | ICD-10-CM

## 2020-10-03 NOTE — Therapy (Signed)
[place], [name], [address] Phone: [phone]   Fax:  [phone]  Physical Therapy Treatment  Patient Details  Name: Frances [name] MRN: [id number] Date of Birth: [birth year] Referring Provider (PT): Jean Rosenthal MD   Encounter Date: 10/03/2020   PT End of Session - 10/03/20 1430    Visit Number 3    Number of Visits 8    Date for PT Re-Evaluation 10/24/20    Authorization Type BCBS    PT Start Time 1408    PT Stop Time 1450    PT Time Calculation (min) 42 min    Activity Tolerance Patient tolerated treatment well    Behavior During Therapy [facility] for tasks assessed/performed           Past Medical History:  Diagnosis Date  . Asthma     Past Surgical History:  Procedure Laterality Date  . ABDOMINAL HYSTERECTOMY    . APPENDECTOMY  05/1997   RIGHT OVARIE,   . LAPAROSCOPIC CHOLECYSTECTOMY  02/2004    There were no vitals filed for this visit.   Subjective Assessment - 10/03/20 1406    Subjective Pt states that she is sore today but not to bad.    Limitations Lifting;Standing;Walking;House hold activities    Diagnostic tests xrays    Patient Stated Goals have normal gait, walk longer distance, decrease    Currently in Pain? Yes    Pain Score 4     Pain Location Knee    Pain Orientation Right;Medial    Pain Descriptors / Indicators Sharp    Pain Type Acute pain    Pain Onset Other (comment)    Pain Frequency Constant    Aggravating Factors  wt bearing    Pain Relieving Factors rest                             [facility] Adult PT Treatment/Exercise - 10/03/20 0001      Exercises   Exercises Knee/Hip      Knee/Hip Exercises: Stretches   Knee: Self-Stretch to increase Flexion Right;20 seconds;4 reps    Gastroc Stretch Limitations slant board 3 30"    Other Knee/Hip Stretches supine leg crosss over piriformis stretch.      Knee/Hip Exercises: Aerobic   Nustep  level 3 x 5'      Knee/Hip Exercises: Standing   Heel Raises 15 reps    Heel Raises Limitations toe raises x 10    Knee Flexion Right;10 reps    Terminal Knee Extension Right;10 reps    Functional Squat 10 reps    Rocker Board 2 minutes    SLS with Vectors Step up 4"  310x 10" B                    PT Short Term Goals - 09/25/20 1640      PT SHORT TERM GOAL #1   Title Patient will be independent with initial HEP and self-management strategies to improve functional outcomes    Time 2    Period Weeks    Status New    Target Date 10/10/20             PT Long Term Goals - 09/25/20 1640      PT LONG TERM GOAL #1   Title Patient will improve FOTO score by 10% to indicate improvement in functional outcomes    Time 4  Period Weeks    Status New    Target Date 10/24/20      PT LONG TERM GOAL #2   Title Patient will report at least 80% overall improvement in subjective complaint to indicate improvement in ability to perform ADLs.    Time 4    Period Weeks    Status New    Target Date 10/24/20      PT LONG TERM GOAL #3   Title Patient will have RT knee AROM 0-125 degrees to improve functional mobility and facilitate squatting to pick up items from floor.    Time 4    Period Weeks    Status New    Target Date 10/24/20      PT LONG TERM GOAL #4   Title Patient will be independent with final HEP and self-management strategies to improve functional outcomes    Time 4    Period Weeks    Status New    Target Date 10/24/20                 Plan - 10/03/20 1430    Clinical Impression Statement Pt states she is sore from the exercises and being on her feet all day long.  Pt treatment advanced to wt bearing activity with minimal cuing needed for proper technique of exercises.  Therapist stressed to pt to use functional squat for functional activites    Examination-Activity Limitations Stand;Stairs;Locomotion Level;Transfers    Examination-Participation  Restrictions Occupation;Community Activity;Yard Work    Stability/Clinical Decision Making Stable/Uncomplicated    Rehab Potential Good    PT Frequency 2x / week    PT Duration 4 weeks    PT Treatment/Interventions ADLs/Self Care Home Management;Aquatic Therapy;Biofeedback;Cryotherapy;Parrafin;Fluidtherapy;Therapeutic activities;Functional mobility training;Ultrasound;Traction;Cognitive remediation;Patient/family education;Manual lymph drainage;Manual techniques;Dry needling;Splinting;Visual/perceptual remediation/compensation;Passive range of motion;Vasopneumatic Device;Scar mobilization;Compression bandaging;Taping;Joint Manipulations;Orthotic Fit/Training;Therapeutic exercise;Contrast Bath;Electrical Stimulation;DME Instruction;Iontophoresis 4mg /ml Dexamethasone;Balance training;Gait training;Stair training;Neuromuscular re-education;Moist Heat;Spinal Manipulations;Energy conservation    PT Next Visit Plan Progress with focus on RT quad and patellar mobility. Progress RT hip and knee strength as tolerated with focus on quad and hip abduction. Manual for patellar mobility. Show ITB table stretch.    PT Home Exercise Plan Eval: quad set, SLR, heel slide with strap, prone quad stretch 10/01/20: bridge, heel raise, calf stretch; 12/17 :  functional squat , vector    Consulted and Agree with Plan of Care Patient           Patient will benefit from skilled therapeutic intervention in order to improve the following deficits and impairments:  Abnormal gait,Pain,Improper body mechanics,Increased fascial restricitons,Impaired flexibility,Difficulty walking,Decreased activity tolerance,Decreased range of motion,Decreased strength,Hypomobility  Visit Diagnosis: Right knee pain, unspecified chronicity  Other abnormalities of gait and mobility     Problem List Patient Active Problem List   Diagnosis Date Noted  . Morbidly obese ([place]) 09/09/2020  . Routine general medical examination at a health care  facility 09/07/2012  . ASTHMA 01/10/2009  . DERMATITIS, SEBORRHEIC NOS 05/11/2007   Rayetta Humphrey, PT CLT [phone] 10/03/2020, 2:49 PM  [place] [address] [place], [name], [address] Phone: [phone]   Fax:  [phone]  Name: Frances [name] MRN: [id number] Date of Birth: 1971/06/24

## 2020-10-06 ENCOUNTER — Ambulatory Visit (HOSPITAL_COMMUNITY): Payer: BC Managed Care – PPO | Admitting: Physical Therapy

## 2020-10-06 ENCOUNTER — Encounter (HOSPITAL_COMMUNITY): Payer: Self-pay | Admitting: Physical Therapy

## 2020-10-06 ENCOUNTER — Other Ambulatory Visit: Payer: Self-pay

## 2020-10-06 DIAGNOSIS — M25561 Pain in right knee: Secondary | ICD-10-CM | POA: Diagnosis not present

## 2020-10-06 DIAGNOSIS — R2689 Other abnormalities of gait and mobility: Secondary | ICD-10-CM

## 2020-10-06 NOTE — Therapy (Signed)
[facility] [address] [place], [name], [address] Phone: [phone]   Fax:  [phone]  Physical Therapy Treatment  Patient Details  Name: Frances [name] MRN: [id number] Date of Birth: [birth year] Referring Provider (PT): Jean Rosenthal MD   Encounter Date: 10/06/2020   PT End of Session - 10/06/20 0833    Visit Number 4    Number of Visits 8    Date for PT Re-Evaluation 10/24/20    Authorization Type BCBS    PT Start Time 0834    PT Stop Time 0913    PT Time Calculation (min) 39 min    Activity Tolerance Patient tolerated treatment well    Behavior During Therapy [facility] for tasks assessed/performed           Past Medical History:  Diagnosis Date  . Asthma     Past Surgical History:  Procedure Laterality Date  . ABDOMINAL HYSTERECTOMY    . APPENDECTOMY  05/1997   RIGHT OVARIE,   . LAPAROSCOPIC CHOLECYSTECTOMY  02/2004    There were no vitals filed for this visit.   Subjective Assessment - 10/06/20 0834    Subjective Patient states her knee is feeling good. Her knee has been feeling better in the morning. Home exercises are going well. She was a little sore after last session which worked itself out.    Limitations Lifting;Standing;Walking;House hold activities    Diagnostic tests xrays    Patient Stated Goals have normal gait, walk longer distance, decrease    Currently in Pain? Yes    Pain Score 2     Pain Location Knee    Pain Orientation Right    Pain Onset Other (comment)                             [facility] Adult PT Treatment/Exercise - 10/06/20 0001      Knee/Hip Exercises: Stretches   Gastroc Stretch Limitations slant board 3 30"      Knee/Hip Exercises: Standing   Heel Raises 20 reps;Both    Knee Flexion Right;10 reps    Terminal Knee Extension Right;10 reps    Terminal Knee Extension Limitations 5-10 second holds with blue thick band    Lateral Step Up Right;1 set;10  reps;Step Height: 4"    Forward Step Up Right;10 reps;Step Height: 4";Hand Hold: 1    Functional Squat 10 reps    Rocker Board 2 minutes    Rocker Board Limitations with slight squat, lateral    SLS with Vectors 5x 5 second holds bilateral    Other Standing Knee Exercises lateral stepping 2x15 feet                  PT Education - 10/06/20 [phone]    Education Details Patient educated on HEP, exercise mechanics    Person(s) Educated Patient    Methods Explanation;Demonstration    Comprehension Verbalized understanding;Returned demonstration            PT Short Term Goals - 09/25/20 1640      PT SHORT TERM GOAL #1   Title Patient will be independent with initial HEP and self-management strategies to improve functional outcomes    Time 2    Period Weeks    Status New    Target Date 10/10/20             PT Long Term Goals - 09/25/20 1640  PT LONG TERM GOAL #1   Title Patient will improve FOTO score by 10% to indicate improvement in functional outcomes    Time 4    Period Weeks    Status New    Target Date 10/24/20      PT LONG TERM GOAL #2   Title Patient will report at least 80% overall improvement in subjective complaint to indicate improvement in ability to perform ADLs.    Time 4    Period Weeks    Status New    Target Date 10/24/20      PT LONG TERM GOAL #3   Title Patient will have RT knee AROM 0-125 degrees to improve functional mobility and facilitate squatting to pick up items from floor.    Time 4    Period Weeks    Status New    Target Date 10/24/20      PT LONG TERM GOAL #4   Title Patient will be independent with final HEP and self-management strategies to improve functional outcomes    Time 4    Period Weeks    Status New    Target Date 10/24/20                 Plan - 10/06/20 0834    Clinical Impression Statement Patient showing unsteadiness in RLE with step up exercise and is given cueing for unilateral UE support for  balance. Patient completes well with cueing for eccentric control. She demonstrates good motor control with lateral step down but requires intermittent UE support for balance/weakness. Patient with good squatting mechanics with no cueing. Patient fatigues quickly with resisted TKE and requires cueing for decreased intensity to reduce knee pain. Patient will continue to benefit from skilled physical therapy in order to reduce impairment and improve function.    Examination-Activity Limitations Stand;Stairs;Locomotion Level;Transfers    Examination-Participation Restrictions Occupation;Community Activity;Yard Work    Stability/Clinical Decision Making Stable/Uncomplicated    Rehab Potential Good    PT Frequency 2x / week    PT Duration 4 weeks    PT Treatment/Interventions ADLs/Self Care Home Management;Aquatic Therapy;Biofeedback;Cryotherapy;Parrafin;Fluidtherapy;Therapeutic activities;Functional mobility training;Ultrasound;Traction;Cognitive remediation;Patient/family education;Manual lymph drainage;Manual techniques;Dry needling;Splinting;Visual/perceptual remediation/compensation;Passive range of motion;Vasopneumatic Device;Scar mobilization;Compression bandaging;Taping;Joint Manipulations;Orthotic Fit/Training;Therapeutic exercise;Contrast Bath;Electrical Stimulation;DME Instruction;Iontophoresis 4mg /ml Dexamethasone;Balance training;Gait training;Stair training;Neuromuscular re-education;Moist Heat;Spinal Manipulations;Energy conservation    PT Next Visit Plan Progress with focus on RT quad and patellar mobility. Progress RT hip and knee strength as tolerated with focus on quad and hip abduction. Manual for patellar mobility. Show ITB table stretch.    PT Home Exercise Plan Eval: quad set, SLR, heel slide with strap, prone quad stretch 10/01/20: bridge, heel raise, calf stretch; 12/17 :  functional squat , vector    Consulted and Agree with Plan of Care Patient           Patient will benefit  from skilled therapeutic intervention in order to improve the following deficits and impairments:  Abnormal gait,Pain,Improper body mechanics,Increased fascial restricitons,Impaired flexibility,Difficulty walking,Decreased activity tolerance,Decreased range of motion,Decreased strength,Hypomobility  Visit Diagnosis: Right knee pain, unspecified chronicity  Other abnormalities of gait and mobility     Problem List Patient Active Problem List   Diagnosis Date Noted  . Morbidly obese ([place]) 09/09/2020  . Routine general medical examination at a health care facility 09/07/2012  . ASTHMA 01/10/2009  . DERMATITIS, SEBORRHEIC NOS 05/11/2007    9:16 AM, 10/06/20 Mearl Latin PT, DPT Physical Therapist at [facility]  [place], [name], [address] Phone: [phone]   Fax:  [phone]  Name: NEEYA PRIGMORE MRN: [id number] Date of Birth: [birth year]

## 2020-10-08 ENCOUNTER — Other Ambulatory Visit: Payer: Self-pay

## 2020-10-08 ENCOUNTER — Ambulatory Visit (HOSPITAL_COMMUNITY): Payer: BC Managed Care – PPO | Admitting: Physical Therapy

## 2020-10-08 DIAGNOSIS — R2689 Other abnormalities of gait and mobility: Secondary | ICD-10-CM

## 2020-10-08 DIAGNOSIS — M25561 Pain in right knee: Secondary | ICD-10-CM

## 2020-10-08 NOTE — Therapy (Signed)
[place], [name], [address] Phone: [phone]   Fax:  [phone]  Physical Therapy Treatment  Patient Details  Name: Frances [name] MRN: [id number] Date of Birth: Mar 20, 1971 Referring Provider (PT): Jean Rosenthal MD   Encounter Date: 10/08/2020   PT End of Session - 10/08/20 0912    Visit Number 5    Number of Visits 8    Date for PT Re-Evaluation 10/24/20    Authorization Type BCBS    PT Start Time 0832    PT Stop Time 0925    PT Time Calculation (min) 53 min    Activity Tolerance Patient tolerated treatment well    Behavior During Therapy [facility] for tasks assessed/performed           Past Medical History:  Diagnosis Date  . Asthma     Past Surgical History:  Procedure Laterality Date  . ABDOMINAL HYSTERECTOMY    . APPENDECTOMY  05/1997   RIGHT OVARIE,   . LAPAROSCOPIC CHOLECYSTECTOMY  02/2004    There were no vitals filed for this visit.   Subjective Assessment - 10/08/20 0834    Subjective pt reports soreness in her knee that is more difuse rather than pinpoint as it was.    Currently in Pain? Yes    Pain Score 2     Pain Location Knee    Pain Orientation Right    Pain Descriptors / Indicators Aching                             [facility] Adult PT Treatment/Exercise - 10/08/20 0001      Knee/Hip Exercises: Stretches   Gastroc Stretch Right;3 reps;30 seconds    Gastroc Stretch Limitations slant board 3 30"      Knee/Hip Exercises: Standing   Heel Raises 20 reps;Both    Terminal Knee Extension Right;15 reps    Terminal Knee Extension Limitations 5-10 second holds with blue thick band    Lateral Step Up Right;1 set;Step Height: 4";15 reps    Forward Step Up Right;Step Height: 4";Hand Hold: 1;15 reps    Step Down Right;15 reps;Hand Hold: 1;Step Height: 2"    Functional Squat 15 reps    SLS with Vectors 5x 5 second holds bilateral    Other Standing Knee  Exercises lateral stepping 2x15 feet with BTB around thighs                  PT Education - 10/08/20 0932    Education Details discussed POC; need for re-eval before 1/7 (pt will be out with husband after surgery)    Person(s) Educated Patient    Methods Explanation    Comprehension Verbalized understanding            PT Short Term Goals - 09/25/20 1640      PT SHORT TERM GOAL #1   Title Patient will be independent with initial HEP and self-management strategies to improve functional outcomes    Time 2    Period Weeks    Status New    Target Date 10/10/20             PT Long Term Goals - 09/25/20 1640      PT LONG TERM GOAL #1   Title Patient will improve FOTO score by 10% to indicate improvement in functional outcomes    Time 4    Period Weeks    Status  New    Target Date 10/24/20      PT LONG TERM GOAL #2   Title Patient will report at least 80% overall improvement in subjective complaint to indicate improvement in ability to perform ADLs.    Time 4    Period Weeks    Status New    Target Date 10/24/20      PT LONG TERM GOAL #3   Title Patient will have RT knee AROM 0-125 degrees to improve functional mobility and facilitate squatting to pick up items from floor.    Time 4    Period Weeks    Status New    Target Date 10/24/20      PT LONG TERM GOAL #4   Title Patient will be independent with final HEP and self-management strategies to improve functional outcomes    Time 4    Period Weeks    Status New    Target Date 10/24/20                 Plan - 10/08/20 0951    Clinical Impression Statement Pt overall improving.  Completed all therex today with minimal cues, good form maintained.  Increased reps of most exercises to 15.  Progressed sidestepping with addition of blue theraband and given band to complete with HEP with sidestepping and terminal knee extension.  Added forward step down with pain on 4" step so reduced to 2" with more control  and no pain.  Discussed POC moving forward as pt will be limited in participation following husbands surgery scheduled on 1/7.  To discuss this further next visit. Encouraged to complete a short walk to see how knee responds to this.  No pain reported at end of session today.    Examination-Activity Limitations Stand;Stairs;Locomotion Level;Transfers    Examination-Participation Restrictions Occupation;Community Activity;Yard Work    Stability/Clinical Decision Making Stable/Uncomplicated    Rehab Potential Good    PT Frequency 2x / week    PT Duration 4 weeks    PT Treatment/Interventions ADLs/Self Care Home Management;Aquatic Therapy;Biofeedback;Cryotherapy;Parrafin;Fluidtherapy;Therapeutic activities;Functional mobility training;Ultrasound;Traction;Cognitive remediation;Patient/family education;Manual lymph drainage;Manual techniques;Dry needling;Splinting;Visual/perceptual remediation/compensation;Passive range of motion;Vasopneumatic Device;Scar mobilization;Compression bandaging;Taping;Joint Manipulations;Orthotic Fit/Training;Therapeutic exercise;Contrast Bath;Electrical Stimulation;DME Instruction;Iontophoresis 4mg /ml Dexamethasone;Balance training;Gait training;Stair training;Neuromuscular re-education;Moist Heat;Spinal Manipulations;Energy conservation    PT Next Visit Plan Continue with focus on RT quad and patellar mobility as well as RT hip and knee strength. Manual for patellar mobiliy if needed.  Discuss continued POC (reducing frequency vs discharge)    PT Home Exercise Plan Eval: quad set, SLR, heel slide with strap, prone quad stretch 10/01/20: bridge, heel raise, calf stretch; 12/17 :  functional squat , vector  12/22: sidestepping and TKE with blue theraband    Consulted and Agree with Plan of Care Patient           Patient will benefit from skilled therapeutic intervention in order to improve the following deficits and impairments:  Abnormal gait,Pain,Improper body  mechanics,Increased fascial restricitons,Impaired flexibility,Difficulty walking,Decreased activity tolerance,Decreased range of motion,Decreased strength,Hypomobility  Visit Diagnosis: Right knee pain, unspecified chronicity  Other abnormalities of gait and mobility     Problem List Patient Active Problem List   Diagnosis Date Noted  . Morbidly obese ([place]) 09/09/2020  . Routine general medical examination at a health care facility 09/07/2012  . ASTHMA 01/10/2009  . DERMATITIS, SEBORRHEIC NOS 05/11/2007   Teena Irani, PTA/CLT [phone]  Teena Irani 10/08/2020, 9:54 AM  [place], [name],  [place] Phone: [phone]   Fax:  [phone]  Name: SHAKERA EBRAHIMI MRN: [id number] Date of Birth: [birth year]

## 2020-10-13 ENCOUNTER — Encounter (HOSPITAL_COMMUNITY): Payer: Self-pay | Admitting: Physical Therapy

## 2020-10-13 ENCOUNTER — Ambulatory Visit (HOSPITAL_COMMUNITY): Payer: BC Managed Care – PPO | Admitting: Physical Therapy

## 2020-10-13 ENCOUNTER — Other Ambulatory Visit: Payer: Self-pay

## 2020-10-13 DIAGNOSIS — M25561 Pain in right knee: Secondary | ICD-10-CM | POA: Diagnosis not present

## 2020-10-13 DIAGNOSIS — R2689 Other abnormalities of gait and mobility: Secondary | ICD-10-CM

## 2020-10-13 NOTE — Patient Instructions (Signed)
Access Code: [email or web address] URL: [email or web address] Date: 10/13/2020 Prepared by: Revonda Humphrey  Exercises Alternating Single Leg Bridge - 1 x daily - 7 x weekly - 3 sets - 10 reps Supine Hip and Knee Flexion AROM with Swiss Ball - 1 x daily - 7 x weekly - 3 sets - 5 reps

## 2020-10-13 NOTE — Therapy (Signed)
[facility] [address] [place], [name], [address] Phone: [phone]   Fax:  [phone]  Physical Therapy Treatment and Progress Note and RECERT  Patient Details  Name: Frances [name] MRN: [id number] Date of Birth: Mar 04, 1971 Referring Provider (PT): Jean Rosenthal MD  Progress Note Reporting Period 09/25/20 to 10/13/20  See note below for Objective Data and Assessment of Progress/Goals.        Encounter Date: 10/13/2020   PT End of Session - 10/13/20 0831    Visit Number 6    Number of Visits 9    Date for PT Re-Evaluation 11/24/20    Authorization Type BCBS    Progress Note Due on Visit 16    PT Start Time 0832    PT Stop Time 0910    PT Time Calculation (min) 38 min    Activity Tolerance Patient tolerated treatment well    Behavior During Therapy [facility] for tasks assessed/performed           Past Medical History:  Diagnosis Date  . Asthma     Past Surgical History:  Procedure Laterality Date  . ABDOMINAL HYSTERECTOMY    . APPENDECTOMY  05/1997   RIGHT OVARIE,   . LAPAROSCOPIC CHOLECYSTECTOMY  02/2004    There were no vitals filed for this visit.   Subjective Assessment - 10/13/20 0916    Subjective States that she can tell what is contributing her knee, 2weakness and excess weight. States that she started walking on level ground and felt that it went alright but could feel it along the medial side of the knee. Stats that she walked about 1.3 miles initially and then 1.5 miles yesterday. States she thinks she can walk about 3-4x/week. States she would like to try a HEP and trial a recheck. States her husband is out for at least 3 solid weeks and up to 8 weeks. States she wants to know how to progress and what the next step is to how to progress. Reports she feels about 75% better since the start of PT. States that she can get up from a sitting position and walk which she couldn't do before.               [facility] PT Assessment - 10/13/20 0001      Assessment   Medical Diagnosis RT knee pain    Referring Provider (PT) Jean Rosenthal MD    Next MD Visit 11/10/20      Observation/Other Assessments   Focus on Therapeutic Outcomes (FOTO)  69% function   was 63% function     AROM   Right Knee Extension 0    Right Knee Flexion 128                         [facility] Adult PT Treatment/Exercise - 10/13/20 0001      Knee/Hip Exercises: Supine   Quad Sets 15 reps;Both   with kickout alternating   Other Supine Knee/Hip Exercises hamstring curls on ball 2x5                  PT Education - 10/13/20 0917    Education Details on walking program, exercise progression in regards to frequency, intensity and reps/sets. FOTO score, current POC and plan moving forward.    Person(s) Educated Patient    Methods Explanation    Comprehension Verbalized understanding            PT  Short Term Goals - 10/13/20 0849      PT SHORT TERM GOAL #1   Title Patient will be independent with initial HEP and self-management strategies to improve functional outcomes    Time 2    Period Weeks    Status Achieved    Target Date 10/10/20             PT Long Term Goals - 10/13/20 9629      PT LONG TERM GOAL #1   Title Patient will improve FOTO score by 10% to indicate improvement in functional outcomes    Baseline was 63% now 69%    Time 4    Period Weeks    Status On-going      PT LONG TERM GOAL #2   Title Patient will report at least 80% overall improvement in subjective complaint to indicate improvement in ability to perform ADLs.    Baseline 75% better    Time 4    Period Weeks    Status On-going      PT LONG TERM GOAL #3   Title Patient will have RT knee AROM 0-125 degrees to improve functional mobility and facilitate squatting to pick up items from floor.    Baseline 0-128    Time 4    Period Weeks    Status Achieved      PT LONG TERM GOAL #4   Title  Patient will be independent with final HEP and self-management strategies to improve functional outcomes    Time 4    Period Weeks    Status Achieved                 Plan - 10/13/20 5284    Clinical Impression Statement Patient present for a progress note on today's sate. Progressing well towards goals with 1/1 short term goal met and 2/4 long term goals met. Discussed progress with patient and progression of exercises. Discussed walking program and strengthening program moving forward. Extending POC additional 6 weeks with one additional visit added onto current certification to focus on HEP development and progress already established exercises. Patient would continue to benefit from skilled physical therapy to improve functional mobility and overall outcomes.    Examination-Activity Limitations Stand;Stairs;Locomotion Level;Transfers    Examination-Participation Restrictions Occupation;Community Activity;Yard Work    Stability/Clinical Decision Making Stable/Uncomplicated    Rehab Potential Good    PT Frequency 1x / week    PT Duration 6 weeks    PT Treatment/Interventions ADLs/Self Care Home Management;Aquatic Therapy;Biofeedback;Cryotherapy;Parrafin;Fluidtherapy;Therapeutic activities;Functional mobility training;Ultrasound;Traction;Cognitive remediation;Patient/family education;Manual lymph drainage;Manual techniques;Dry needling;Splinting;Visual/perceptual remediation/compensation;Passive range of motion;Vasopneumatic Device;Scar mobilization;Compression bandaging;Taping;Joint Manipulations;Orthotic Fit/Training;Therapeutic exercise;Contrast Bath;Electrical Stimulation;DME Instruction;Iontophoresis 8m/ml Dexamethasone;Balance training;Gait training;Stair training;Neuromuscular re-education;Moist Heat;Spinal Manipulations;Energy conservation    PT Next Visit Plan progress HEP, anticipate 4 weeks until next PT visit - focus on progression of exercises patient already has and continued  strengthening    PT Home Exercise Plan Eval: quad set, SLR, heel slide with strap, prone quad stretch 10/01/20: bridge, heel raise, calf stretch; 12/17 :  functional squat , vector  12/22: sidestepping and TKE with blue theraband; 12/27 hamstring curls on ball, bride kickouts    Consulted and Agree with Plan of Care Patient           Patient will benefit from skilled therapeutic intervention in order to improve the following deficits and impairments:  Abnormal gait,Pain,Improper body mechanics,Increased fascial restricitons,Impaired flexibility,Difficulty walking,Decreased activity tolerance,Decreased range of motion,Decreased strength,Hypomobility  Visit Diagnosis: Right knee pain, unspecified chronicity  Other abnormalities  of gait and mobility     Problem List Patient Active Problem List   Diagnosis Date Noted  . Morbidly obese ([place]) 09/09/2020  . Routine general medical examination at a health care facility 09/07/2012  . ASTHMA 01/10/2009  . DERMATITIS, SEBORRHEIC NOS 05/11/2007    9:19 AM, 10/13/20 Jerene Pitch, DPT Physical Therapy with [facility]  [phone] office  [facility] [address] [place], [name], [address] Phone: [phone]   Fax:  [phone]  Name: Frances [name] MRN: [id number] Date of Birth: July 30, 1971

## 2020-10-16 ENCOUNTER — Encounter (HOSPITAL_COMMUNITY): Payer: BC Managed Care – PPO | Admitting: Physical Therapy

## 2020-10-23 ENCOUNTER — Ambulatory Visit (HOSPITAL_COMMUNITY): Payer: BC Managed Care – PPO | Attending: Orthopaedic Surgery | Admitting: Physical Therapy

## 2020-10-23 ENCOUNTER — Other Ambulatory Visit: Payer: Self-pay

## 2020-10-23 DIAGNOSIS — R2689 Other abnormalities of gait and mobility: Secondary | ICD-10-CM | POA: Insufficient documentation

## 2020-10-23 DIAGNOSIS — M25561 Pain in right knee: Secondary | ICD-10-CM | POA: Diagnosis not present

## 2020-10-23 NOTE — Therapy (Signed)
[place], [name], [address] Phone: [phone]   Fax:  [phone]  Physical Therapy Treatment  Patient Details  Name: Frances [name] MRN: [id number] Date of Birth: 1971/02/10 Referring Provider (PT): Jean Rosenthal MD   Encounter Date: 10/23/2020   PT End of Session - 10/23/20 1546    Visit Number 7    Number of Visits 9    Date for PT Re-Evaluation 11/24/20    Authorization Type BCBS    Progress Note Due on Visit 16    PT Start Time 1533    PT Stop Time 1612    PT Time Calculation (min) 39 min    Activity Tolerance Patient tolerated treatment well    Behavior During Therapy [facility] for tasks assessed/performed           Past Medical History:  Diagnosis Date  . Asthma     Past Surgical History:  Procedure Laterality Date  . ABDOMINAL HYSTERECTOMY    . APPENDECTOMY  05/1997   RIGHT OVARIE,   . LAPAROSCOPIC CHOLECYSTECTOMY  02/2004    There were no vitals filed for this visit.   Subjective Assessment - 10/23/20 1533    Subjective Pt states very minimal anterior knee pain at this time.    Currently in Pain? No/denies                             [facility] Adult PT Treatment/Exercise - 10/23/20 0001      Knee/Hip Exercises: Stretches   Knee: Self-Stretch to increase Flexion Right;20 seconds;4 reps    Gastroc Stretch Limitations slant board 3 30"      Knee/Hip Exercises: Aerobic   Nustep level 3 x 5'      Knee/Hip Exercises: Machines for Strengthening   Total Gym Leg Press 4pl x 15      Knee/Hip Exercises: Standing   Heel Raises Right;10 reps    Knee Flexion Right;10 reps    Knee Flexion Limitations 3    Forward Lunges Both;10 reps    Terminal Knee Extension Right;15 reps    Lateral Step Up Right;10 reps;Hand Hold: 0;Step Height: 6"    Forward Step Up Right;10 reps;Hand Hold: 0;Step Height: 6"    Functional Squat 5 reps    Functional Squat Limitations full squat  fingers to floor    Stairs 1 RT    SLS with Vectors 3 x 10"      Knee/Hip Exercises: Seated   Sit to Sand 10 reps                    PT Short Term Goals - 10/13/20 0849      PT SHORT TERM GOAL #1   Title Patient will be independent with initial HEP and self-management strategies to improve functional outcomes    Time 2    Period Weeks    Status Achieved    Target Date 10/10/20             PT Long Term Goals - 10/13/20 [id number]      PT LONG TERM GOAL #1   Title Patient will improve FOTO score by 10% to indicate improvement in functional outcomes    Baseline was 63% now 69%    Time 4    Period Weeks    Status On-going      PT LONG TERM GOAL #2   Title Patient will report at  least 80% overall improvement in subjective complaint to indicate improvement in ability to perform ADLs.    Baseline 75% better    Time 4    Period Weeks    Status On-going      PT LONG TERM GOAL #3   Title Patient will have RT knee AROM 0-125 degrees to improve functional mobility and facilitate squatting to pick up items from floor.    Baseline 0-128    Time 4    Period Weeks    Status Achieved      PT LONG TERM GOAL #4   Title Patient will be independent with final HEP and self-management strategies to improve functional outcomes    Time 4    Period Weeks    Status Achieved                 Plan - 10/23/20 1548    Clinical Impression Statement Pt progressing well with strengthening and decreased pain.  PT is up to walking 1.75 miles with her husband.  Pt progressed to leg press, sit to stand and steps for improved strength.    Examination-Activity Limitations Stand;Stairs;Locomotion Level;Transfers    Examination-Participation Restrictions Occupation;Community Activity;Yard Work    Stability/Clinical Decision Making Stable/Uncomplicated    Optometrist Low    Rehab Potential Good    PT Frequency 1x / week    PT Duration 6 weeks    PT Treatment/Interventions  ADLs/Self Care Home Management;Aquatic Therapy;Biofeedback;Cryotherapy;Parrafin;Fluidtherapy;Therapeutic activities;Functional mobility training;Ultrasound;Traction;Cognitive remediation;Patient/family education;Manual lymph drainage;Manual techniques;Dry needling;Splinting;Visual/perceptual remediation/compensation;Passive range of motion;Vasopneumatic Device;Scar mobilization;Compression bandaging;Taping;Joint Manipulations;Orthotic Fit/Training;Therapeutic exercise;Contrast Bath;Electrical Stimulation;DME Instruction;Iontophoresis 4mg /ml Dexamethasone;Balance training;Gait training;Stair training;Neuromuscular re-education;Moist Heat;Spinal Manipulations;Energy conservation    PT Next Visit Plan , anticipate 4 weeks until next PT visit - focus on progression of exercises patient already has and continued strengthening    PT Home Exercise Plan Eval: quad set, SLR, heel slide with strap, prone quad stretch 10/01/20: bridge, heel raise, calf stretch; 12/17 :  functional squat , vector  12/22: sidestepping and TKE with blue theraband; 12/27 hamstring curls on ball, bride kickouts; 1/6 added sit to stand, lunge, side lunge, step up and lateral step up    Consulted and Agree with Plan of Care Patient           Patient will benefit from skilled therapeutic intervention in order to improve the following deficits and impairments:  Abnormal gait,Pain,Improper body mechanics,Increased fascial restricitons,Impaired flexibility,Difficulty walking,Decreased activity tolerance,Decreased range of motion,Decreased strength,Hypomobility  Visit Diagnosis: Right knee pain, unspecified chronicity  Other abnormalities of gait and mobility     Problem List Patient Active Problem List   Diagnosis Date Noted  . Morbidly obese ([facility]) 09/09/2020  . Routine general medical examination at a health care facility 09/07/2012  . ASTHMA 01/10/2009  . DERMATITIS, SEBORRHEIC NOS 05/11/2007    05/13/2007, PT  CLT [phone] 10/23/2020, 4:14 PM  [facility] [address] [place], [place], [place] Phone: [phone]   Fax:  [phone]  Name: Frances [name] MRN: Rosanne Sack Date of Birth: [birth year]

## 2020-11-10 ENCOUNTER — Encounter (HOSPITAL_COMMUNITY): Payer: Self-pay | Admitting: Physical Therapy

## 2020-11-10 ENCOUNTER — Encounter: Payer: Self-pay | Admitting: Orthopaedic Surgery

## 2020-11-10 ENCOUNTER — Ambulatory Visit (INDEPENDENT_AMBULATORY_CARE_PROVIDER_SITE_OTHER): Payer: BC Managed Care – PPO | Admitting: Orthopaedic Surgery

## 2020-11-10 ENCOUNTER — Telehealth (HOSPITAL_COMMUNITY): Payer: Self-pay | Admitting: Physical Therapy

## 2020-11-10 DIAGNOSIS — M25561 Pain in right knee: Secondary | ICD-10-CM | POA: Diagnosis not present

## 2020-11-10 DIAGNOSIS — G8929 Other chronic pain: Secondary | ICD-10-CM | POA: Diagnosis not present

## 2020-11-10 NOTE — Progress Notes (Signed)
The patient comes in today after having a long course of physical therapy on her right knee.  She has some slight valgus malalignment of that knee and hyperextends.  Her knee pain bilaterally and some central.  She has been very pleased with physical therapy.  She is worked on activity modification and avoiding high impact aerobic activities.  She feels like she is doing a lot better.  She is on meloxicam as needed and does report decreased knee pain.  She is working on weight loss as well.  Examination of her right knee shows no effusion.  Range of motion is full but her knee does hyperextend and has some valgus malalignment.  There is a negative and Lachman's and negative McMurray's exam.  At this point she can start physical therapy and continue to work on weight loss and home exercise program.  If she develops any worsening knee symptoms including mechanical symptoms such as locking catching she will let us know.  At that point we would likely order a MRI of the knee.  All questions and concerns were answered addressed.  Follow-up can be as needed.

## 2020-11-10 NOTE — Therapy (Signed)
[place], [name], [address] Phone: [phone]   Fax:  [phone]  Patient Details  Name: Frances [name] MRN: [id number] Date of Birth: 04/19/1971 Referring Provider:  Jean Rosenthal MD  Encounter Date: 11/10/2020  PHYSICAL THERAPY DISCHARGE SUMMARY  Visits from Start of Care: 7  Current functional level related to goals / functional outcomes: Unable to reassess, patient did not return for formal DC    Remaining deficits: Unable to reassess, patient did not return for formal DC    Education / Equipment: Patient called to request DC from therapy at this time. Patient had made good progress and with most goals met, but did not return for formal DC form therapy.  Plan: Patient agrees to discharge.  Patient goals were partially met. Patient is being discharged due to being pleased with the current functional level.  ?????       5:25 PM, 11/10/20 Josue Hector PT DPT  Physical Therapist with [facility]  [phone] [place] [address] [place], [name], [address] Phone: [phone]   Fax:  [phone]

## 2020-11-10 NOTE — Telephone Encounter (Signed)
Pt requested to be d/c at this time

## 2020-11-12 ENCOUNTER — Ambulatory Visit (HOSPITAL_COMMUNITY): Payer: BC Managed Care – PPO | Admitting: Physical Therapy

## 2020-12-24 ENCOUNTER — Ambulatory Visit: Payer: BC Managed Care – PPO

## 2021-02-02 ENCOUNTER — Inpatient Hospital Stay: Admission: RE | Admit: 2021-02-02 | Payer: BC Managed Care – PPO | Source: Ambulatory Visit

## 2021-02-13 LAB — HM COLONOSCOPY

## 2021-02-25 ENCOUNTER — Encounter: Payer: Self-pay | Admitting: *Deleted

## 2021-03-09 ENCOUNTER — Encounter: Payer: Self-pay | Admitting: Family Medicine

## 2021-03-09 ENCOUNTER — Other Ambulatory Visit: Payer: Self-pay

## 2021-03-09 ENCOUNTER — Ambulatory Visit (INDEPENDENT_AMBULATORY_CARE_PROVIDER_SITE_OTHER): Payer: BC Managed Care – PPO | Admitting: Family Medicine

## 2021-03-09 VITALS — BP 130/80 | HR 76 | Temp 98.6°F | Resp 17 | Ht 64.0 in | Wt 275.2 lb

## 2021-03-09 DIAGNOSIS — R600 Localized edema: Secondary | ICD-10-CM

## 2021-03-09 DIAGNOSIS — K625 Hemorrhage of anus and rectum: Secondary | ICD-10-CM | POA: Insufficient documentation

## 2021-03-09 DIAGNOSIS — C569 Malignant neoplasm of unspecified ovary: Secondary | ICD-10-CM | POA: Insufficient documentation

## 2021-03-09 DIAGNOSIS — G4700 Insomnia, unspecified: Secondary | ICD-10-CM | POA: Insufficient documentation

## 2021-03-09 DIAGNOSIS — Z0000 Encounter for general adult medical examination without abnormal findings: Secondary | ICD-10-CM | POA: Diagnosis not present

## 2021-03-09 DIAGNOSIS — Z800 Family history of malignant neoplasm of digestive organs: Secondary | ICD-10-CM | POA: Insufficient documentation

## 2021-03-09 DIAGNOSIS — M545 Low back pain, unspecified: Secondary | ICD-10-CM | POA: Insufficient documentation

## 2021-03-09 LAB — HEPATIC FUNCTION PANEL
ALT: 19 U/L (ref 0–35)
AST: 16 U/L (ref 0–37)
Albumin: 4.2 g/dL (ref 3.5–5.2)
Alkaline Phosphatase: 89 U/L (ref 39–117)
Bilirubin, Direct: 0.1 mg/dL (ref 0.0–0.3)
Total Bilirubin: 0.5 mg/dL (ref 0.2–1.2)
Total Protein: 6.8 g/dL (ref 6.0–8.3)

## 2021-03-09 LAB — CBC WITH DIFFERENTIAL/PLATELET
Basophils Absolute: 0 10*3/uL (ref 0.0–0.1)
Basophils Relative: 0.8 % (ref 0.0–3.0)
Eosinophils Absolute: 0.2 10*3/uL (ref 0.0–0.7)
Eosinophils Relative: 3.1 % (ref 0.0–5.0)
HCT: 44.8 % (ref 36.0–46.0)
Hemoglobin: 15.2 g/dL — ABNORMAL HIGH (ref 12.0–15.0)
Lymphocytes Relative: 30.6 % (ref 12.0–46.0)
Lymphs Abs: 1.9 10*3/uL (ref 0.7–4.0)
MCHC: 33.9 g/dL (ref 30.0–36.0)
MCV: 81.3 fl (ref 78.0–100.0)
Monocytes Absolute: 0.4 10*3/uL (ref 0.1–1.0)
Monocytes Relative: 6.6 % (ref 3.0–12.0)
Neutro Abs: 3.7 10*3/uL (ref 1.4–7.7)
Neutrophils Relative %: 58.9 % (ref 43.0–77.0)
Platelets: 231 10*3/uL (ref 150.0–400.0)
RBC: 5.51 Mil/uL — ABNORMAL HIGH (ref 3.87–5.11)
RDW: 14.3 % (ref 11.5–15.5)
WBC: 6.2 10*3/uL (ref 4.0–10.5)

## 2021-03-09 LAB — LIPID PANEL
Cholesterol: 139 mg/dL (ref 0–200)
HDL: 55.7 mg/dL (ref >39.00)
LDL Cholesterol: 66 mg/dL (ref 0–99)
NonHDL: 83.57
Total CHOL/HDL Ratio: 3
Triglycerides: 90 mg/dL (ref 0.0–149.0)
VLDL: 18 mg/dL (ref 0.0–40.0)

## 2021-03-09 LAB — TSH: TSH: 1.01 u[IU]/mL (ref 0.35–4.50)

## 2021-03-09 LAB — BASIC METABOLIC PANEL
BUN: 13 mg/dL (ref 6–23)
CO2: 24 mEq/L (ref 19–32)
Calcium: 9.2 mg/dL (ref 8.4–10.5)
Chloride: 105 mEq/L (ref 96–112)
Creatinine, Ser: 0.66 mg/dL (ref 0.40–1.20)
GFR: 102.19 mL/min (ref >60.00)
Glucose, Bld: 94 mg/dL (ref 70–99)
Potassium: 4.2 mEq/L (ref 3.5–5.1)
Sodium: 139 mEq/L (ref 135–145)

## 2021-03-09 LAB — VITAMIN D 25 HYDROXY (VIT D DEFICIENCY, FRACTURES): VITD: 20.08 ng/mL — ABNORMAL LOW (ref 30.00–100.00)

## 2021-03-09 MED ORDER — FUROSEMIDE 20 MG PO TABS: 20.0000 mg | ORAL_TABLET | Freq: Every day | ORAL | 3 refills | Status: DC

## 2021-03-09 NOTE — Patient Instructions (Addendum)
Follow up in 1 year or as needed We'll notify you of your lab results and make any changes if needed Continue to work on healthy diet and regular exercise- you can do it! Use the Furosemide (lasix) as needed for swelling Call with any questions or concerns Stay Safe!  Stay Healthy! Have a great summer!!!

## 2021-03-09 NOTE — Progress Notes (Signed)
   Subjective:    Patient ID: Frances [name], female    DOB: 12-31-1970, 50 y.o.   MRN: [id number]  HPI CPE- UTD on colonoscopy, flu, Tdap, COVID.  Due for mammo- scheduled.  No need for pap- hysterectomy  Reviewed past medical, surgical, family and social histories.   Health Maintenance  Topic Date Due  . MAMMOGRAM  08/19/2020  . COVID-19 Vaccine (4 - Booster for Pfizer series) 03/25/2021 (Originally 12/30/2020)  . INFLUENZA VACCINE  05/18/2021  . TETANUS/TDAP  09/09/2030  . COLONOSCOPY (Pts 45-19yrs Insurance coverage will need to be confirmed)  02/14/2031  . Hepatitis C Screening  Completed  . HIV Screening  Completed  . HPV VACCINES  Aged Out      Review of Systems Patient reports no vision/ hearing changes, adenopathy,fever, weight change,  persistant/recurrent hoarseness , swallowing issues, chest pain, palpitations, persistant/recurrent cough, hemoptysis, dyspnea (rest/exertional/paroxysmal nocturnal), gastrointestinal bleeding (melena, rectal bleeding), abdominal pain, significant heartburn, bowel changes, GU symptoms (dysuria, hematuria, incontinence), Gyn symptoms (abnormal  bleeding, pain),  syncope, focal weakness, memory loss, numbness & tingling, skin/hair/nail changes, abnormal bruising or bleeding, anxiety, or depression.   + bilateral LE edema  This visit occurred during the SARS-CoV-2 public health emergency.  Safety protocols were in place, including screening questions prior to the visit, additional usage of staff PPE, and extensive cleaning of exam room while observing appropriate contact time as indicated for disinfecting solutions.       Objective:   Physical Exam General Appearance:    Alert, cooperative, no distress, appears stated age, obese  Head:    Normocephalic, without obvious abnormality, atraumatic  Eyes:    PERRL, conjunctiva/corneas clear, EOM's intact, fundi    benign, both eyes  Ears:    Normal TM's and external ear canals, both ears  Nose:    Deferred due to COVID  Throat:   Neck:   Supple, symmetrical, trachea midline, no adenopathy;    Thyroid: no enlargement/tenderness/nodules  Back:     Symmetric, no curvature, ROM normal, no CVA tenderness  Lungs:     Clear to auscultation bilaterally, respirations unlabored  Chest Wall:    No tenderness or deformity   Heart:    Regular rate and rhythm, S1 and S2 normal, no murmur, rub   or gallop  Breast Exam:    Deferred to mammo  Abdomen:     Soft, non-tender, bowel sounds active all four quadrants,    no masses, no organomegaly  Genitalia:    Deferred  Rectal:    Extremities:   Extremities normal, atraumatic, no cyanosis or edema  Pulses:   2+ and symmetric all extremities  Skin:   Skin color, texture, turgor normal, no rashes or lesions  Lymph nodes:   Cervical, supraclavicular, and axillary nodes normal  Neurologic:   CNII-XII intact, normal strength, sensation and reflexes    throughout          Assessment & Plan:

## 2021-03-09 NOTE — Assessment & Plan Note (Signed)
Ongoing issue for pt.  BMI is 47.24  Stressed need for healthy diet and regular exercise.  Check labs to risk stratify.  Will follow.

## 2021-03-09 NOTE — Assessment & Plan Note (Signed)
Pt's PE WNL w/ exception of obesity.  UTD on immunizations, mammo scheduled.  Check labs.  Anticipatory guidance provided.

## 2021-03-10 ENCOUNTER — Other Ambulatory Visit: Payer: Self-pay

## 2021-03-10 MED ORDER — VITAMIN D (ERGOCALCIFEROL) 1.25 MG (50000 UNIT) PO CAPS: 50000.0000 [IU] | ORAL_CAPSULE | ORAL | 0 refills | Status: AC

## 2021-03-23 ENCOUNTER — Other Ambulatory Visit: Payer: Self-pay

## 2021-03-23 ENCOUNTER — Ambulatory Visit
Admission: RE | Admit: 2021-03-23 | Discharge: 2021-03-23 | Disposition: A | Discharge status: Home / Self Care | Payer: BC Managed Care – PPO | Source: Ambulatory Visit | Attending: Family Medicine | Admitting: Family Medicine

## 2021-03-23 DIAGNOSIS — Z1231 Encounter for screening mammogram for malignant neoplasm of breast: Secondary | ICD-10-CM

## 2021-03-25 ENCOUNTER — Other Ambulatory Visit: Payer: Self-pay | Admitting: Family Medicine

## 2021-03-25 DIAGNOSIS — R928 Other abnormal and inconclusive findings on diagnostic imaging of breast: Secondary | ICD-10-CM

## 2021-04-15 ENCOUNTER — Encounter: Payer: Self-pay | Admitting: *Deleted

## 2021-04-16 ENCOUNTER — Other Ambulatory Visit: Payer: Self-pay

## 2021-04-16 ENCOUNTER — Ambulatory Visit
Admission: RE | Admit: 2021-04-16 | Discharge: 2021-04-16 | Disposition: A | Discharge status: Home / Self Care | Payer: BC Managed Care – PPO | Source: Ambulatory Visit | Attending: Family Medicine | Admitting: Family Medicine

## 2021-04-16 DIAGNOSIS — R928 Other abnormal and inconclusive findings on diagnostic imaging of breast: Secondary | ICD-10-CM

## 2021-08-23 IMAGING — MG MM DIGITAL DIAGNOSTIC UNILAT*R* W/ TOMO W/ CAD
4 series · 4 of 12 positions shown · non-contrast
Comparison: Previous exam(s).

CLINICAL DATA: Patient returns after screening study for evaluation
of a possible RIGHT breast mass.

EXAM:
DIGITAL DIAGNOSTIC UNILATERAL RIGHT MAMMOGRAM WITH TOMOSYNTHESIS AND
CAD; ULTRASOUND RIGHT BREAST LIMITED
TECHNIQUE: Right digital diagnostic mammography and breast tomosynthesis was
performed. The images were evaluated with computer-aided detection.;
Targeted ultrasound examination of the right breast was performed

[R MLO synth-2D]
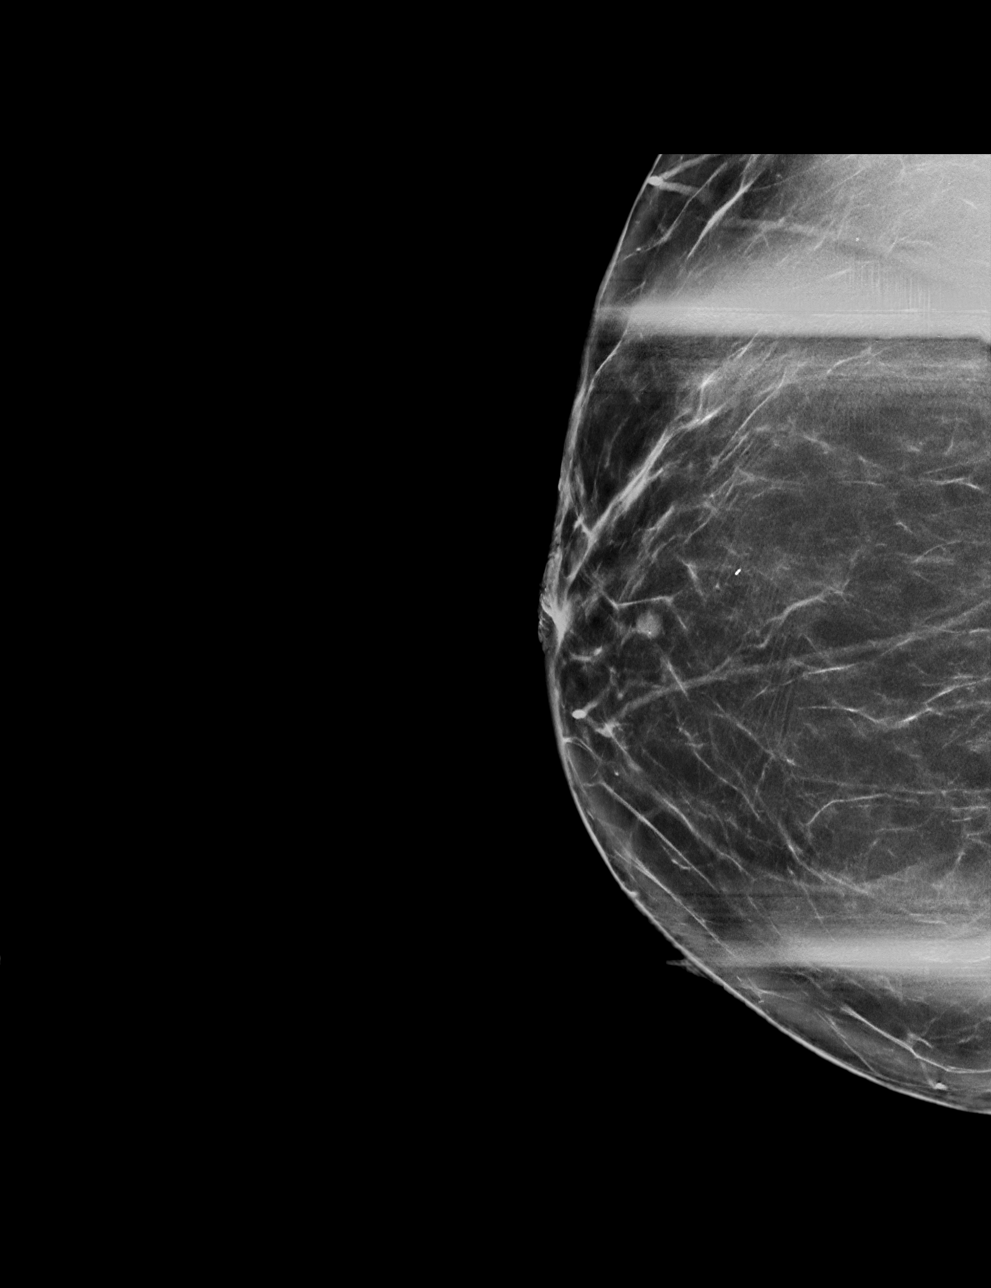

[R CC synth-2D]
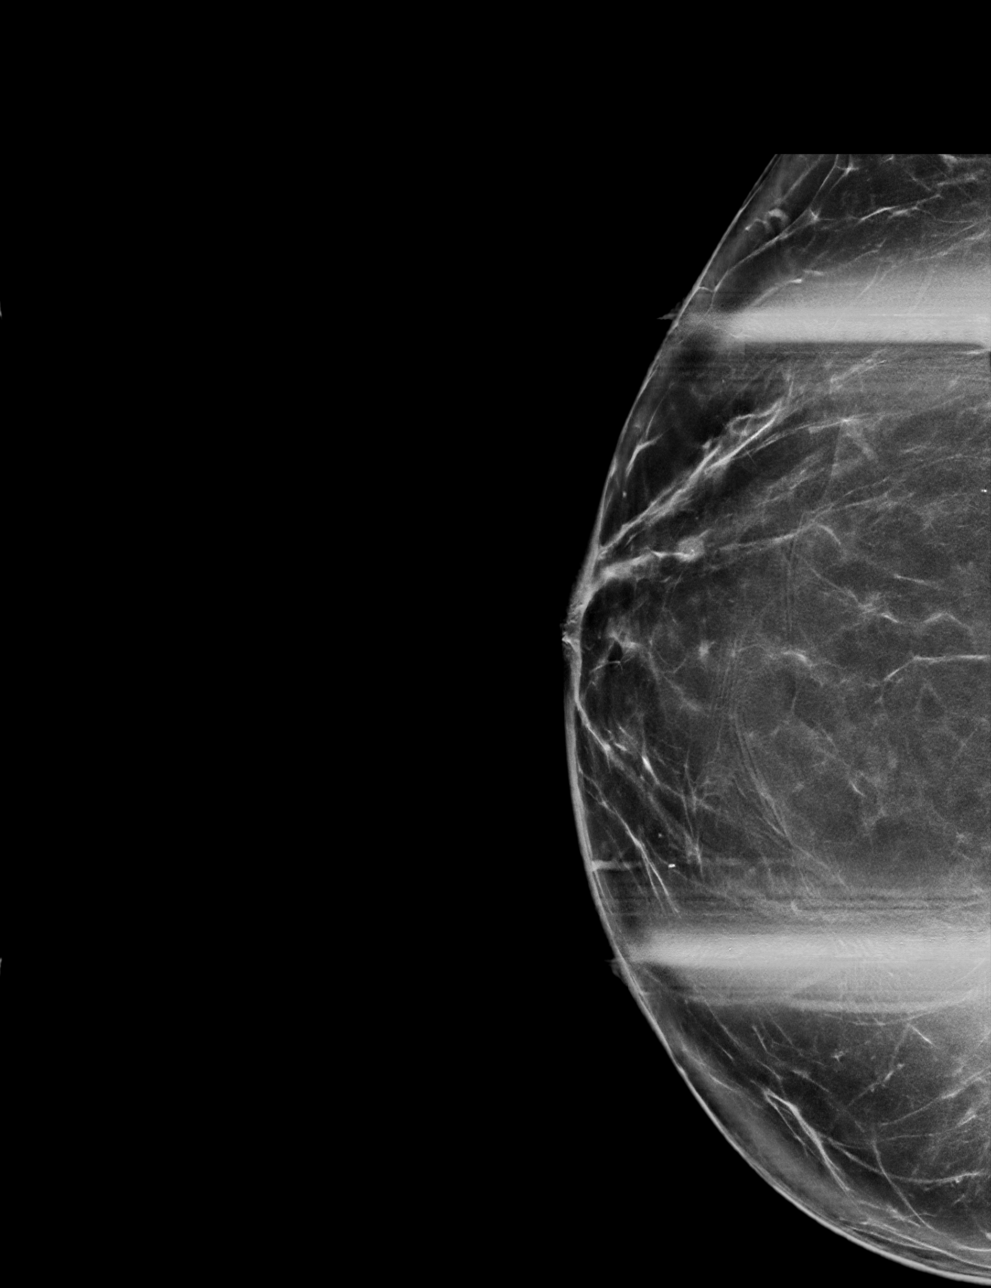

[R CC tomo · tomo slice 37/72.0]
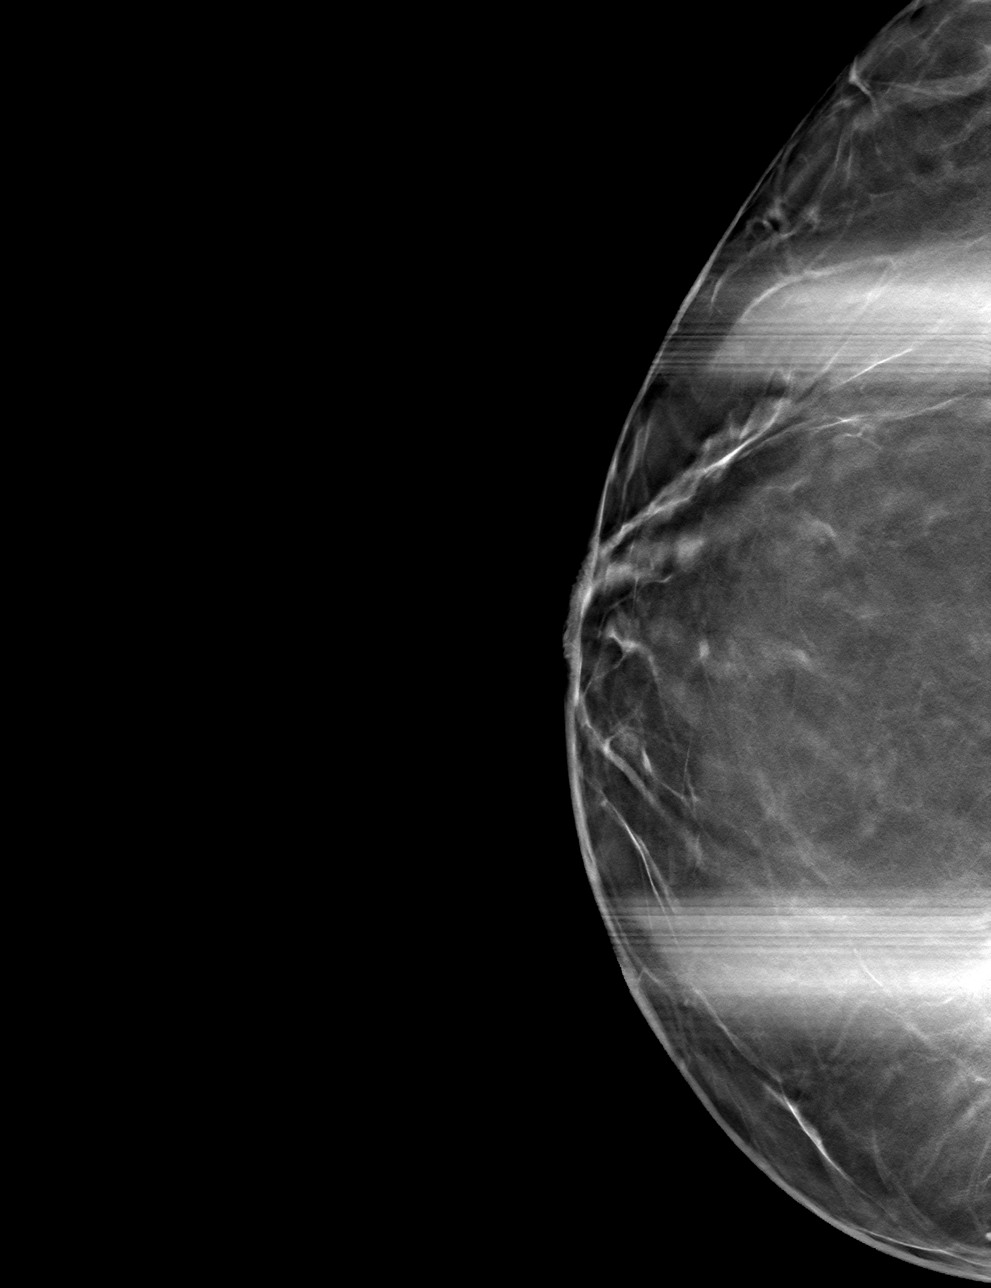

[R MLO tomo · tomo slice 41/81.0]
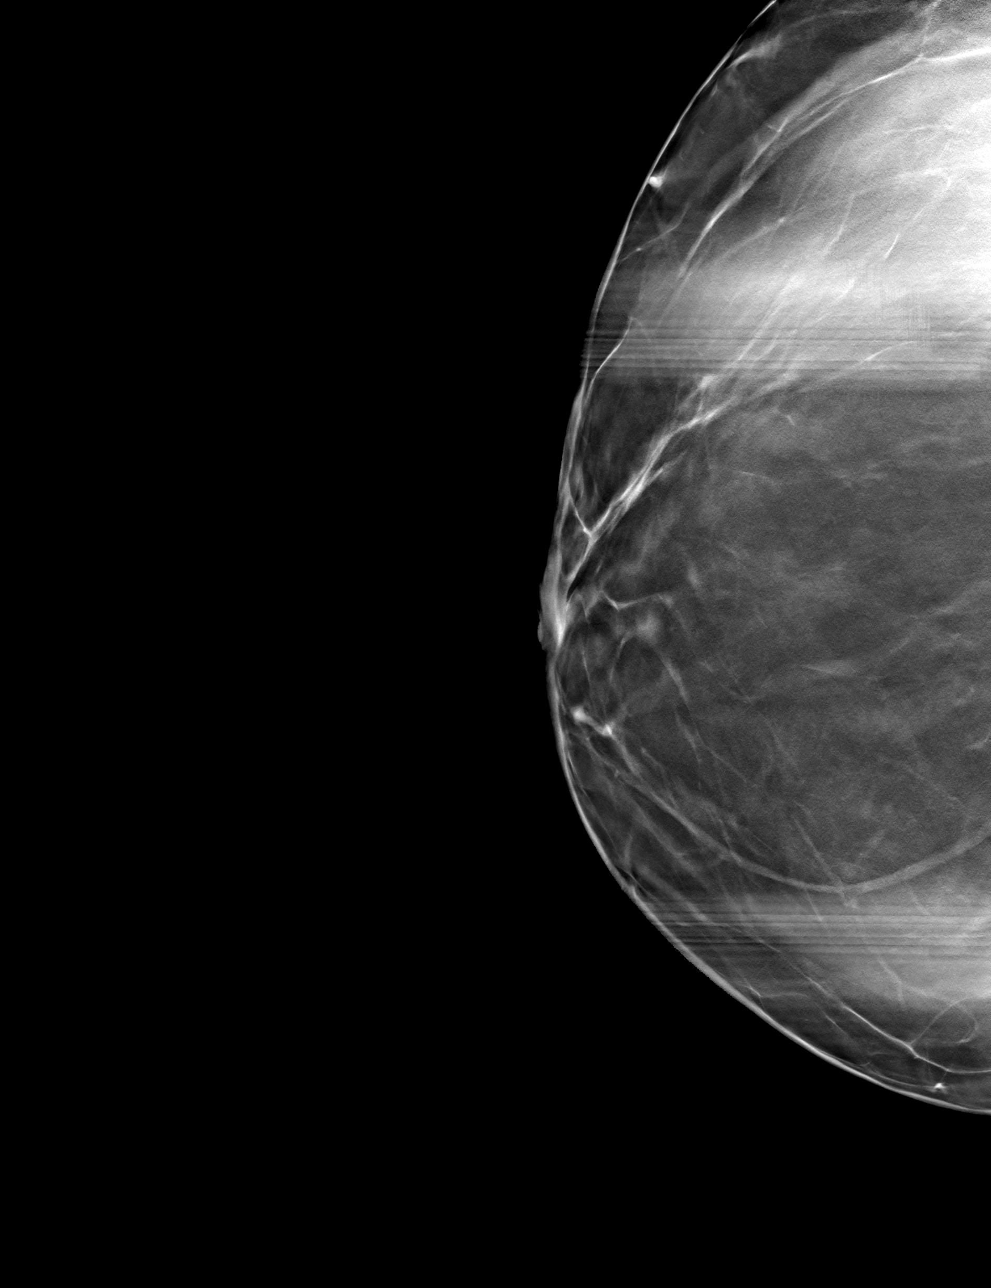

[4 of 12 positions shown; findings below may reference images not displayed]

ACR Breast Density Category b: There are scattered areas of
fibroglandular density.
FINDINGS: Additional 2-D and 3-D images are performed. These views confirm
presence of a circumscribed mass in the LOWER OUTER breast for
region of the RIGHT breast.

Targeted ultrasound is performed, showing an anechoic simple cyst in
the 6 o'clock retroareolar region of the RIGHT breast measuring
x 0.4 x 0.4 centimeters.
IMPRESSION: Simple cyst in the RIGHT breast. No mammographic or ultrasound
evidence for malignancy.

RECOMMENDATION:
Screening mammogram in one year.(Code:[id number]-[id number])

I have discussed the findings and recommendations with the patient.
If applicable, a reminder letter will be sent to the patient
regarding the next appointment.

BI-RADS CATEGORY  2: Benign.

## 2021-09-12 ENCOUNTER — Ambulatory Visit
Admission: EM | Admit: 2021-09-12 | Discharge: 2021-09-12 | Disposition: A | Discharge status: Home / Self Care | Payer: BC Managed Care – PPO

## 2021-09-12 DIAGNOSIS — R509 Fever, unspecified: Secondary | ICD-10-CM

## 2021-09-12 DIAGNOSIS — J020 Streptococcal pharyngitis: Secondary | ICD-10-CM | POA: Diagnosis not present

## 2021-09-12 LAB — POCT RAPID STREP A (OFFICE): Rapid Strep A Screen: POSITIVE — AB

## 2021-09-12 MED ORDER — AMOXICILLIN 500 MG PO CAPS: 500.0000 mg | ORAL_CAPSULE | Freq: Three times a day (TID) | ORAL | 0 refills | Status: DC

## 2021-09-12 NOTE — ED Provider Notes (Signed)
[facility]    CSN: [id number] Arrival date & time: 09/12/21  0946      History   Chief Complaint Chief Complaint  Patient presents with   Headache    Headache, body aches, ear pain, sore throat x2 days    HPI Frances [name] is a 50 y.o. female.   Pt feels like she has strep.  Pt complains of a sore throat and fever  Pt works in school  The history is provided by the patient. No language interpreter was used.  Headache Radiates to:  Does not radiate Timing:  Constant Progression:  Worsening Chronicity:  New Relieved by:  Nothing Worsened by:  Nothing Ineffective treatments:  None tried  Past Medical History:  Diagnosis Date   Asthma     Patient Active Problem List   Diagnosis Date Noted   Family history of malignant neoplasm of gastrointestinal tract 03/09/2021   Hemorrhage of rectum and anus 03/09/2021   Insomnia 03/09/2021   Low back pain 03/09/2021   Morbidly obese ([employer]) 09/09/2020   Routine general medical examination at a health care facility 09/07/2012   ASTHMA 01/10/2009   DERMATITIS, SEBORRHEIC NOS 05/11/2007    Past Surgical History:  Procedure Laterality Date   ABDOMINAL HYSTERECTOMY     APPENDECTOMY  05/1997   RIGHT OVARIE,    LAPAROSCOPIC CHOLECYSTECTOMY  02/2004    OB History   No obstetric history on file.      Home Medications    Prior to Admission medications   Medication Sig Start Date End Date Taking? Authorizing Provider  acetaminophen (TYLENOL) 500 MG tablet Take 500 mg by mouth every 6 (six) hours as needed (PRN).   Yes [provider]  amoxicillin (AMOXIL) 500 MG capsule Take 1 capsule (500 mg total) by mouth 3 (three) times daily. 09/12/21  Yes Caryl Ada K, PA-C  furosemide (LASIX) 20 MG tablet Take 1 tablet (20 mg total) by mouth daily. 03/09/21  Yes Midge Minium, MD  ibuprofen (ADVIL) 200 MG tablet Take 200 mg by mouth every 6 (six) hours as needed.   Yes [provider]   Multiple Vitamin (MULTIVITAMIN) tablet Take 1 tablet by mouth daily.   Yes [provider]  loratadine (CLARITIN) 10 MG tablet Take 10 mg by mouth daily.    [provider]  meloxicam (MOBIC) 15 MG tablet Take 1 tablet (15 mg total) by mouth daily. 09/09/20   Midge Minium, MD    Family History Family History  Problem Relation Age of Onset   Cancer Mother        colon   Diabetes Mother        type 2   Cancer Father    Cancer Maternal Grandmother    Diabetes Maternal Grandmother     Social History Social History   Tobacco Use   Smoking status: Never   Smokeless tobacco: Never  Substance Use Topics   Alcohol use: Not Currently   Drug use: Never     Allergies   Patient has no known allergies.   Review of Systems Review of Systems  Neurological:  Positive for headaches.  All other systems reviewed and are negative.   Physical Exam Triage Vital Signs ED Triage Vitals  Enc Vitals Group     BP 09/12/21 1310 140/83     Pulse Rate 09/12/21 1310 (!) 106     Resp 09/12/21 1310 20     Temp 09/12/21 1310 (!) 101.2 F (38.4  C)     Temp Source 09/12/21 1310 Oral     SpO2 09/12/21 1310 95 %     Weight 09/12/21 1308 282 lb (127.9 kg)     Height 09/12/21 1308 5\' 6"  (1.676 m)     Head Circumference --      Peak Flow --      Pain Score 09/12/21 1308 6     Pain Loc --      Pain Edu? --      Excl. in [place]? --    No data found.  Updated Vital Signs BP 140/83 (BP Location: Right Arm)   Pulse (!) 106   Temp (!) 101.2 F (38.4 C) (Oral)   Resp 20   Ht 5\' 6"  (1.676 m)   Wt 127.9 kg   SpO2 95%   BMI 45.52 kg/m   Visual Acuity Right Eye Distance:   Left Eye Distance:   Bilateral Distance:    Right Eye Near:   Left Eye Near:    Bilateral Near:     Physical Exam Vitals and nursing note reviewed.  Constitutional:      Appearance: She is well-developed.  HENT:     Head: Normocephalic.     Mouth/Throat:     Mouth: Mucous membranes are  moist.     Comments: Swollen tonsuls Cardiovascular:     Rate and Rhythm: Normal rate.  Pulmonary:     Effort: Pulmonary effort is normal.  Abdominal:     General: There is no distension.  Musculoskeletal:        General: Normal range of motion.     Cervical back: Normal range of motion.  Neurological:     Mental Status: She is alert and oriented to person, place, and time.     UC Treatments / Results  Labs (all labs ordered are listed, but only abnormal results are displayed) Labs Reviewed  POCT RAPID STREP A (OFFICE) - Abnormal; Notable for the following components:      Result Value   Rapid Strep A Screen Positive (*)    All other components within normal limits  COVID-19, FLU A+B NAA    EKG   Radiology No results found.  Procedures Procedures (including critical care time)  Medications Ordered in UC Medications - No data to display  Initial Impression / Assessment and Plan / UC Course  I have reviewed the triage vital signs and the nursing notes.  Pertinent labs & imaging results that were available during my care of the patient were reviewed by me and considered in my medical decision making (see chart for details).      Final Clinical Impressions(s) / UC Diagnoses   Final diagnoses:  Streptococcal sore throat   Discharge Instructions   None    ED Prescriptions     Medication Sig Dispense Auth. Provider   amoxicillin (AMOXIL) 500 MG capsule Take 1 capsule (500 mg total) by mouth 3 (three) times daily. 30 capsule Fransico Meadow, [place]      PDMP not reviewed this encounter.   Fransico Meadow, [place] 09/12/21 1400

## 2021-09-12 NOTE — ED Triage Notes (Signed)
Pt states that she has a headache, body aches, ear pain, sore throat, nasal congestion. X2 days  Pt states that she is vaccinated for covid.  Pt states that she has had her flu vaccine.

## 2021-09-13 LAB — COVID-19, FLU A+B NAA
Influenza A, NAA: NOT DETECTED
Influenza B, NAA: NOT DETECTED
SARS-CoV-2, NAA: NOT DETECTED

## 2022-04-07 ENCOUNTER — Other Ambulatory Visit: Payer: Self-pay | Admitting: Family Medicine

## 2024-06-29 ENCOUNTER — Other Ambulatory Visit: Payer: Self-pay | Admitting: Family Medicine

## 2024-06-29 DIAGNOSIS — Z1231 Encounter for screening mammogram for malignant neoplasm of breast: Secondary | ICD-10-CM

## 2024-07-02 ENCOUNTER — Ambulatory Visit (INDEPENDENT_AMBULATORY_CARE_PROVIDER_SITE_OTHER): Admitting: Family Medicine

## 2024-07-02 ENCOUNTER — Encounter: Payer: Self-pay | Admitting: Family Medicine

## 2024-07-02 VITALS — BP 128/74 | HR 79 | Ht 66.0 in | Wt 254.4 lb

## 2024-07-02 DIAGNOSIS — Z23 Encounter for immunization: Secondary | ICD-10-CM

## 2024-07-02 DIAGNOSIS — Z0000 Encounter for general adult medical examination without abnormal findings: Secondary | ICD-10-CM | POA: Diagnosis not present

## 2024-07-02 NOTE — Progress Notes (Signed)
   Subjective:    Patient ID: Frances [name], female    DOB: [birth year], 53 y.o.   MRN: [id number]  HPI CPE- UTD on colonoscopy.  Due for mammo- scheduled.  Wants flu shot today.  UTD on Tdap.  Patient Care Team    Relationship Specialty Notifications Start End  Mahlon Comer BRAVO, MD PCP - General Family Medicine  09/09/20     Health Maintenance  Topic Date Due   Hepatitis B Vaccines 19-59 Average Risk (1 of 3 - 19+ 3-dose series) Never done   Pneumococcal Vaccine: 50+ Years (2 of 2 - PCV) 10/23/2009   Zoster Vaccines- Shingrix (1 of 2) Never done   Mammogram  03/24/2023   COVID-19 Vaccine (4 - 2025-26 season) 06/18/2024   DTaP/Tdap/Td (3 - Td or Tdap) 09/09/2030   Colonoscopy  02/14/2031   Influenza Vaccine  Completed   Hepatitis C Screening  Completed   HIV Screening  Completed   HPV VACCINES  Aged Out   Meningococcal B Vaccine  Aged Out      Review of Systems Patient reports no vision/ hearing changes, adenopathy,fever, persistant/recurrent hoarseness , swallowing issues, chest pain, palpitations, edema, persistant/recurrent cough, hemoptysis, dyspnea (rest/exertional/paroxysmal nocturnal), gastrointestinal bleeding (melena, rectal bleeding), abdominal pain, significant heartburn, bowel changes, GU symptoms (dysuria, hematuria, incontinence), Gyn symptoms (abnormal  bleeding, pain),  syncope, focal weakness, memory loss, numbness & tingling, skin/hair/nail changes, abnormal bruising or bleeding, anxiety, or depression.   + 20 lb weight loss- pt has overhauled diet, is walking regularly    Objective:   Physical Exam General Appearance:    Alert, cooperative, no distress, appears stated age  Head:    Normocephalic, without obvious abnormality, atraumatic  Eyes:    PERRL, conjunctiva/corneas clear, EOM's intact both eyes  Ears:    Normal TM's and external ear canals, both ears  Nose:   Nares normal, septum midline, mucosa normal, no drainage    or sinus tenderness   Throat:   Lips, mucosa, and tongue normal; teeth and gums normal  Neck:   Supple, symmetrical, trachea midline, no adenopathy;    Thyroid : no enlargement/tenderness/nodules  Back:     Symmetric, no curvature, ROM normal, no CVA tenderness  Lungs:     Clear to auscultation bilaterally, respirations unlabored  Chest Wall:    No tenderness or deformity   Heart:    Regular rate and rhythm, S1 and S2 normal, no murmur, rub   or gallop  Breast Exam:    Deferred to GYN  Abdomen:     Soft, non-tender, bowel sounds active all four quadrants,    no masses, no organomegaly  Genitalia:    Deferred to GYN  Rectal:    Extremities:   Extremities normal, atraumatic, no cyanosis or edema  Pulses:   2+ and symmetric all extremities  Skin:   Skin color, texture, turgor normal, no rashes or lesions  Lymph nodes:   Cervical, supraclavicular, and axillary nodes normal  Neurologic:   CNII-XII intact, normal strength, sensation and reflexes    throughout          Assessment & Plan:

## 2024-07-02 NOTE — Assessment & Plan Note (Signed)
 Pt's PE WNL w/ exception of BMI.  UTD on colonoscopy, Tdap.  Due for mammo- scheduled.  Flu shot given.  Check labs.  Anticipatory guidance provided.

## 2024-07-02 NOTE — Patient Instructions (Signed)
 Follow up in 6 months to recheck weight loss progress We'll notify you of your lab results and make any changes if needed Continue to work on healthy diet and regular exercise- you look great! Have them send me a copy of your mammogram Call with any questions or concerns Stay Safe!  Stay Healthy! Happy Fall!!!

## 2024-07-03 LAB — TSH: TSH: 0.89 u[IU]/mL (ref 0.35–5.50)

## 2024-07-03 LAB — LIPID PANEL
Cholesterol: 147 mg/dL (ref 0–200)
HDL: 50.1 mg/dL (ref >39.00)
LDL Cholesterol: 64 mg/dL (ref 0–99)
NonHDL: 96.41
Total CHOL/HDL Ratio: 3
Triglycerides: 163 mg/dL — ABNORMAL HIGH (ref 0.0–149.0)
VLDL: 32.6 mg/dL (ref 0.0–40.0)

## 2024-07-03 LAB — CBC WITH DIFFERENTIAL/PLATELET
Basophils Absolute: 0.1 K/uL (ref 0.0–0.1)
Basophils Relative: 0.7 % (ref 0.0–3.0)
Eosinophils Absolute: 0.3 K/uL (ref 0.0–0.7)
Eosinophils Relative: 3.2 % (ref 0.0–5.0)
HCT: 43 % (ref 36.0–46.0)
Hemoglobin: 14.6 g/dL (ref 12.0–15.0)
Lymphocytes Relative: 27.4 % (ref 12.0–46.0)
Lymphs Abs: 2.4 K/uL (ref 0.7–4.0)
MCHC: 33.9 g/dL (ref 30.0–36.0)
MCV: 81.7 fl (ref 78.0–100.0)
Monocytes Absolute: 0.6 K/uL (ref 0.1–1.0)
Monocytes Relative: 7.3 % (ref 3.0–12.0)
Neutro Abs: 5.4 K/uL (ref 1.4–7.7)
Neutrophils Relative %: 61.4 % (ref 43.0–77.0)
Platelets: 241 K/uL (ref 150.0–400.0)
RBC: 5.26 Mil/uL — ABNORMAL HIGH (ref 3.87–5.11)
RDW: 14.1 % (ref 11.5–15.5)
WBC: 8.8 K/uL (ref 4.0–10.5)

## 2024-07-03 LAB — HEPATIC FUNCTION PANEL
ALT: 12 U/L (ref 0–35)
AST: 16 U/L (ref 0–37)
Albumin: 4.3 g/dL (ref 3.5–5.2)
Alkaline Phosphatase: 86 U/L (ref 39–117)
Bilirubin, Direct: 0.1 mg/dL (ref 0.0–0.3)
Total Bilirubin: 0.3 mg/dL (ref 0.2–1.2)
Total Protein: 7.3 g/dL (ref 6.0–8.3)

## 2024-07-03 LAB — HEMOGLOBIN A1C: Hgb A1c MFr Bld: 5.7 % (ref 4.6–6.5)

## 2024-07-03 LAB — VITAMIN D 25 HYDROXY (VIT D DEFICIENCY, FRACTURES): VITD: 29.95 ng/mL — ABNORMAL LOW (ref 30.00–100.00)

## 2024-07-04 ENCOUNTER — Ambulatory Visit: Payer: Self-pay | Admitting: Family Medicine

## 2024-07-04 NOTE — Telephone Encounter (Signed)
 Copied from CRM [id number]. Topic: Clinical - Lab/Test Results >> Jul 04, 2024 11:09 AM Burnard DEL wrote: Reason for CRM: Patient returned call to [facility] regarding lab results.I relayed message to her from provider.Patient verbalized understanding.

## 2024-07-05 ENCOUNTER — Ambulatory Visit
Admission: RE | Admit: 2024-07-05 | Discharge: 2024-07-05 | Disposition: A | Discharge status: Home / Self Care | Source: Ambulatory Visit | Attending: Family Medicine | Admitting: Family Medicine

## 2024-07-05 DIAGNOSIS — Z1231 Encounter for screening mammogram for malignant neoplasm of breast: Secondary | ICD-10-CM

## 2024-07-06 LAB — BASIC METABOLIC PANEL WITH GFR
BUN: 16 mg/dL (ref 6–23)
CO2: 27 meq/L (ref 19–32)
Calcium: 9.5 mg/dL (ref 8.4–10.5)
Chloride: 104 meq/L (ref 96–112)
Creatinine, Ser: 0.64 mg/dL (ref 0.40–1.20)
GFR: 100.58 mL/min (ref >60.00)
Glucose, Bld: 86 mg/dL (ref 70–99)
Potassium: 4 meq/L (ref 3.5–5.1)
Sodium: 137 meq/L — AB (ref 135–145)

## 2024-08-21 LAB — HM COLONOSCOPY

## 2025-01-07 ENCOUNTER — Ambulatory Visit: Admitting: Family Medicine
